# Patient Record
Sex: Male | Born: 1990 | Race: Black or African American | Hispanic: No | Marital: Single | Smoking: Never smoker
Health system: Southern US, Community
[De-identification: ages and names within clinical notes are randomized; demographics above are authoritative.]

## PROBLEM LIST (undated history)

## (undated) DIAGNOSIS — J45909 Unspecified asthma, uncomplicated: Secondary | ICD-10-CM

## (undated) HISTORY — PX: ROTATOR CUFF REPAIR: SHX139

---
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
## 2009-05-31 ENCOUNTER — Emergency Department: Payer: Self-pay | Admitting: Emergency Medicine

## 2009-11-06 ENCOUNTER — Emergency Department: Payer: Self-pay | Admitting: Emergency Medicine

## 2009-12-11 ENCOUNTER — Emergency Department: Payer: Self-pay | Admitting: Emergency Medicine

## 2010-10-04 ENCOUNTER — Emergency Department: Payer: Self-pay | Admitting: Unknown Physician Specialty

## 2011-03-19 ENCOUNTER — Emergency Department: Payer: Self-pay | Admitting: Emergency Medicine

## 2011-04-28 ENCOUNTER — Emergency Department: Payer: Self-pay | Admitting: Emergency Medicine

## 2011-05-02 ENCOUNTER — Emergency Department: Payer: Self-pay | Admitting: Emergency Medicine

## 2011-05-11 ENCOUNTER — Emergency Department: Payer: Self-pay | Admitting: Emergency Medicine

## 2011-05-12 ENCOUNTER — Emergency Department: Payer: Self-pay | Admitting: Emergency Medicine

## 2011-08-19 ENCOUNTER — Emergency Department: Payer: Self-pay | Admitting: Emergency Medicine

## 2012-04-26 ENCOUNTER — Emergency Department: Payer: Self-pay | Admitting: Emergency Medicine

## 2012-04-26 LAB — CBC
HGB: 15 g/dL (ref 13.0–18.0)
MCH: 30.5 pg (ref 26.0–34.0)
MCHC: 33.7 g/dL (ref 32.0–36.0)
MCV: 91 fL (ref 80–100)
Platelet: 300 10*3/uL (ref 150–440)
RDW: 13.2 % (ref 11.5–14.5)

## 2012-04-26 LAB — URINALYSIS, COMPLETE
Bacteria: NONE SEEN
Blood: NEGATIVE
Leukocyte Esterase: NEGATIVE
Ph: 6 (ref 4.5–8.0)
Protein: NEGATIVE
Specific Gravity: 1.019 (ref 1.003–1.030)
WBC UR: <1 /HPF (ref 0–5)

## 2012-04-26 LAB — COMPREHENSIVE METABOLIC PANEL
Anion Gap: 10 (ref 7–16)
BUN: 11 mg/dL (ref 7–18)
Chloride: 100 mmol/L (ref 98–107)
Co2: 26 mmol/L (ref 21–32)
EGFR (African American): >60
Osmolality: 270 (ref 275–301)
Potassium: 3.5 mmol/L (ref 3.5–5.1)
SGOT(AST): 22 U/L (ref 15–37)
SGPT (ALT): 21 U/L (ref 12–78)
Total Protein: 9.3 g/dL — ABNORMAL HIGH (ref 6.4–8.2)

## 2012-04-26 LAB — LIPASE, BLOOD: Lipase: 153 U/L (ref 73–393)

## 2012-04-27 ENCOUNTER — Emergency Department: Payer: Self-pay | Admitting: Emergency Medicine

## 2012-04-27 LAB — BASIC METABOLIC PANEL
Anion Gap: 7 (ref 7–16)
Calcium, Total: 9.2 mg/dL (ref 8.5–10.1)
Chloride: 105 mmol/L (ref 98–107)
Co2: 28 mmol/L (ref 21–32)
Creatinine: 1.13 mg/dL (ref 0.60–1.30)
Glucose: 92 mg/dL (ref 65–99)
Sodium: 140 mmol/L (ref 136–145)

## 2012-04-27 LAB — CBC WITH DIFFERENTIAL/PLATELET
Basophil #: 0.1 10*3/uL (ref 0.0–0.1)
Basophil %: 0.7 %
Eosinophil %: 1.3 %
Lymphocyte #: 1.5 10*3/uL (ref 1.0–3.6)
MCH: 30.3 pg (ref 26.0–34.0)
MCHC: 33.5 g/dL (ref 32.0–36.0)
MCV: 91 fL (ref 80–100)
Monocyte %: 8.4 %
Neutrophil #: 6.4 10*3/uL (ref 1.4–6.5)
Neutrophil %: 72.9 %
Platelet: 255 10*3/uL (ref 150–440)
RBC: 4.28 10*6/uL — ABNORMAL LOW (ref 4.40–5.90)
RDW: 13.3 % (ref 11.5–14.5)

## 2012-04-28 ENCOUNTER — Emergency Department: Payer: Self-pay | Admitting: Emergency Medicine

## 2012-08-07 ENCOUNTER — Emergency Department: Payer: Self-pay | Admitting: Emergency Medicine

## 2012-11-28 ENCOUNTER — Emergency Department: Payer: Self-pay | Admitting: Emergency Medicine

## 2012-11-28 LAB — RAPID INFLUENZA A&B ANTIGENS

## 2012-11-30 LAB — BETA STREP CULTURE(ARMC)

## 2013-02-13 ENCOUNTER — Emergency Department: Payer: Self-pay | Admitting: Emergency Medicine

## 2013-06-22 ENCOUNTER — Emergency Department: Payer: Self-pay | Admitting: Internal Medicine

## 2014-05-27 ENCOUNTER — Emergency Department: Payer: Self-pay | Admitting: Emergency Medicine

## 2014-08-04 ENCOUNTER — Emergency Department: Payer: Self-pay | Admitting: Emergency Medicine

## 2015-01-04 ENCOUNTER — Emergency Department
Admit: 2015-01-04 | Disposition: A | Discharge status: Home / Self Care | Payer: Self-pay | Admitting: Emergency Medicine

## 2015-01-04 LAB — TROPONIN I: Troponin-I: <0.03 ng/mL

## 2015-01-04 LAB — BASIC METABOLIC PANEL
Anion Gap: 6 — ABNORMAL LOW (ref 7–16)
BUN: 12 mg/dL
Calcium, Total: 8.9 mg/dL
Chloride: 107 mmol/L
Co2: 28 mmol/L
Creatinine: 1.19 mg/dL
EGFR (African American): >60
EGFR (Non-African Amer.): >60
GLUCOSE: 94 mg/dL
POTASSIUM: 3.3 mmol/L — AB
SODIUM: 141 mmol/L

## 2015-01-04 LAB — CBC
HCT: 41.5 % (ref 40.0–52.0)
HGB: 13.3 g/dL (ref 13.0–18.0)
MCH: 29.4 pg (ref 26.0–34.0)
MCHC: 32.1 g/dL (ref 32.0–36.0)
MCV: 92 fL (ref 80–100)
PLATELETS: 256 10*3/uL (ref 150–440)
RBC: 4.54 10*6/uL (ref 4.40–5.90)
RDW: 13.1 % (ref 11.5–14.5)
WBC: 6.2 10*3/uL (ref 3.8–10.6)

## 2015-04-29 ENCOUNTER — Emergency Department
Admission: EM | Admit: 2015-04-29 | Discharge: 2015-04-29 | Disposition: A | Discharge status: Home / Self Care | Payer: Self-pay | Attending: Emergency Medicine | Admitting: Emergency Medicine

## 2015-04-29 ENCOUNTER — Encounter: Payer: Self-pay | Admitting: *Deleted

## 2015-04-29 DIAGNOSIS — R0981 Nasal congestion: Secondary | ICD-10-CM

## 2015-04-29 DIAGNOSIS — J0100 Acute maxillary sinusitis, unspecified: Secondary | ICD-10-CM | POA: Insufficient documentation

## 2015-04-29 HISTORY — DX: Unspecified asthma, uncomplicated: J45.909

## 2015-04-29 LAB — POCT RAPID STREP A: Streptococcus, Group A Screen (Direct): NEGATIVE

## 2015-04-29 MED ORDER — ACETAMINOPHEN 325 MG PO TABS
650.0000 mg | ORAL_TABLET | Freq: Once | ORAL | Status: AC | PRN
  Administered 2015-04-29: 650 mg via ORAL
  Filled 2015-04-29: qty 2

## 2015-04-29 MED ORDER — OXYMETAZOLINE HCL 0.05 % NA SOLN
1.0000 | Freq: Once | NASAL | Status: AC
  Administered 2015-04-29: 1 via NASAL
  Filled 2015-04-29: qty 15

## 2015-04-29 MED ORDER — PSEUDOEPHEDRINE HCL 30 MG PO TABS: 30.0000 mg | ORAL_TABLET | Freq: Four times a day (QID) | ORAL | Status: DC | PRN

## 2015-04-29 MED ORDER — IBUPROFEN 800 MG PO TABS: 800.0000 mg | ORAL_TABLET | Freq: Three times a day (TID) | ORAL | Status: DC | PRN

## 2015-04-29 MED ORDER — IBUPROFEN 800 MG PO TABS
800.0000 mg | ORAL_TABLET | Freq: Once | ORAL | Status: AC
  Administered 2015-04-29: 800 mg via ORAL
  Filled 2015-04-29: qty 1

## 2015-04-29 NOTE — ED Notes (Signed)
MD at bedside. 

## 2015-04-29 NOTE — ED Notes (Signed)
Pt presents w/ c/o sinus congestion and drainage, fever, and sore throat.

## 2015-04-29 NOTE — ED Provider Notes (Signed)
[facility] Emergency Department Provider Note  ____________________________________________  Time seen: Approximately 551 AM  I have reviewed the triage vital signs and the nursing notes.   HISTORY  Chief Complaint Sore Throat; Nasal Congestion; and Fever    HPI Glenn Acosta is a 24 y.o. male who comes in tonight with some congestion and sore throat. The patient reports that yesterday he noticed some green mucus when he blew his nose. He also reports that he had a temperature to 99 yesterday morning and his anterior neck has been hurting him. The patient reports that he took a small dose of NyQuil and it did not seem to help his symptoms. The patient reports that he also has a headache that he has not taken anything for. The patient denies any sick contacts and feels as though with the swelling in his neck it is hard to swallow. He rates his headache 8 out of 10 in intensity. The patient has not had any cough or any blurred vision. The patient reports that he was unable to go to work so he decided to come into the emergency department for evaluation.   Past Medical History  Diagnosis Date  . Asthma     There are no active problems to display for this patient.   Past Surgical History  Procedure Laterality Date  . Rotator cuff repair Left     Current Outpatient Rx  Name  Route  Sig  Dispense  Refill  . ibuprofen (ADVIL,MOTRIN) 800 MG tablet   Oral   Take 1 tablet (800 mg total) by mouth every 8 (eight) hours as needed.   15 tablet   0   . pseudoephedrine (SUDAFED) 30 MG tablet   Oral   Take 1 tablet (30 mg total) by mouth every 6 (six) hours as needed for congestion.   24 tablet   0     Allergies Shellfish allergy  History reviewed. No pertinent family history.  Social History Social History  Substance Use Topics  . Smoking status: Never Smoker   . Smokeless tobacco: Never Used  . Alcohol Use: No    Review of  Systems Constitutional: No fever/chills Eyes: No visual changes. ENT:  sore throat, congestion and facial pain Cardiovascular: Denies chest pain. Respiratory: Denies shortness of breath. Gastrointestinal: No abdominal pain.  No nausea, no vomiting.  No diarrhea.  No constipation. Genitourinary: Negative for dysuria. Musculoskeletal: Negative for back pain. Skin: Negative for rash. Neurological: Headache  10-point ROS otherwise negative.  ____________________________________________   PHYSICAL EXAM:  VITAL SIGNS: ED Triage Vitals  Enc Vitals Group     BP 04/29/15 0405 118/75 mmHg     Pulse Rate 04/29/15 0405 66     Resp 04/29/15 0405 16     Temp 04/29/15 0405 98 F (36.7 C)     Temp Source 04/29/15 0405 Oral     SpO2 04/29/15 0405 98 %     Weight 04/29/15 0405 180 lb (81.647 kg)     Height 04/29/15 0405 6\' 3"  (1.905 m)     Head Cir --      Peak Flow --      Pain Score 04/29/15 0406 7     Pain Loc --      Pain Edu? --      Excl. in GC? --     Constitutional: Alert and oriented. Well appearing and in mild distress. Eyes: Conjunctivae are normal. PERRL. EOMI. Head: Atraumatic. Nose:  Congestion, ethmoid, maxillary and  sphenoid sinus tenderness to palpation Mouth/Throat: Mucous membranes are moist.  Oropharynx mildly with no petechiae or exudates Hematological/Lymphatic/Immunilogical: Anterior cervical lymphadenopathy. Cardiovascular: Normal rate, regular rhythm.  Respiratory: Normal respiratory effort.  No retractions. Lungs CTAB. Gastrointestinal: Soft and nontender. No distention. Positive bowel sounds Genitourinary: Deferred Musculoskeletal: No lower extremity tenderness nor edema.   Neurologic:  Normal speech and language.  Skin:  Skin is warm, dry and intact. No rash noted. Psychiatric: Mood and affect are normal.   ____________________________________________   LABS (all labs ordered are listed, but only abnormal results are displayed)  Labs Reviewed   CULTURE, GROUP A STREP ([facility] ONLY)  POCT RAPID STREP A   ____________________________________________  EKG  None ____________________________________________  RADIOLOGY  None ____________________________________________   PROCEDURES  Procedure(s) performed: None  Critical Care performed: No  ____________________________________________   INITIAL IMPRESSION / ASSESSMENT AND PLAN / ED COURSE  Pertinent labs & imaging results that were available during my care of the patient were reviewed by me and considered in my medical decision making (see chart for details).  This is a 24 year old male who comes in with some facial pain, sinus pressure and green mucus. The patient had a strep test that was unremarkable. I will give the patient a dose of ibuprofen 800 mg orally 1 as well as some Afrin to his nose. Since the symptoms started 1 day ago I will not treat with antibiotics as it is more likely viral and have him reassessed by his primary care physician or the acute care clinic. ____________________________________________   FINAL CLINICAL IMPRESSION(S) / ED DIAGNOSES  Final diagnoses:  Acute maxillary sinusitis, recurrence not specified  Nasal congestion      Rebecka Apley, MD 04/29/15 [id number]

## 2015-04-29 NOTE — Discharge Instructions (Signed)
Sinusitis °Sinusitis is redness, soreness, and inflammation of the paranasal sinuses. Paranasal sinuses are air pockets within the bones of your face (beneath the eyes, the middle of the forehead, or above the eyes). In healthy paranasal sinuses, mucus is able to drain out, and air is able to circulate through them by way of your nose. However, when your paranasal sinuses are inflamed, mucus and air can become trapped. This can allow bacteria and other germs to grow and cause infection. °Sinusitis can develop quickly and last only a short time (acute) or continue over a long period (chronic). Sinusitis that lasts for more than 12 weeks is considered chronic.  °CAUSES  °Causes of sinusitis include: °· Allergies. °· Structural abnormalities, such as displacement of the cartilage that separates your nostrils (deviated septum), which can decrease the air flow through your nose and sinuses and affect sinus drainage. °· Functional abnormalities, such as when the small hairs (cilia) that line your sinuses and help remove mucus do not work properly or are not present. °SIGNS AND SYMPTOMS  °Symptoms of acute and chronic sinusitis are the same. The primary symptoms are pain and pressure around the affected sinuses. Other symptoms include: °· Upper toothache. °· Earache. °· Headache. °· Bad breath. °· Decreased sense of smell and taste. °· A cough, which worsens when you are lying flat. °· Fatigue. °· Fever. °· Thick drainage from your nose, which often is green and may contain pus (purulent). °· Swelling and warmth over the affected sinuses. °DIAGNOSIS  °Your health care provider will perform a physical exam. During the exam, your health care provider may: °· Look in your nose for signs of abnormal growths in your nostrils (nasal polyps). °· Tap over the affected sinus to check for signs of infection. °· View the inside of your sinuses (endoscopy) using an imaging device that has a light attached (endoscope). °If your health  care provider suspects that you have chronic sinusitis, one or more of the following tests may be recommended: °· Allergy tests. °· Nasal culture. A sample of mucus is taken from your nose, sent to a lab, and screened for bacteria. °· Nasal cytology. A sample of mucus is taken from your nose and examined by your health care provider to determine if your sinusitis is related to an allergy. °TREATMENT  °Most cases of acute sinusitis are related to a viral infection and will resolve on their own within 10 days. Sometimes medicines are prescribed to help relieve symptoms (pain medicine, decongestants, nasal steroid sprays, or saline sprays).  °However, for sinusitis related to a bacterial infection, your health care provider will prescribe antibiotic medicines. These are medicines that will help kill the bacteria causing the infection.  °Rarely, sinusitis is caused by a fungal infection. In theses cases, your health care provider will prescribe antifungal medicine. °For some cases of chronic sinusitis, surgery is needed. Generally, these are cases in which sinusitis recurs more than 3 times per year, despite other treatments. °HOME CARE INSTRUCTIONS  °· Drink plenty of water. Water helps thin the mucus so your sinuses can drain more easily. °· Use a humidifier. °· Inhale steam 3 to 4 times a day (for example, sit in the bathroom with the shower running). °· Apply a warm, moist washcloth to your face 3 to 4 times a day, or as directed by your health care provider. °· Use saline nasal sprays to help moisten and clean your sinuses. °· Take medicines only as directed by your health care provider. °·   If you were prescribed either an antibiotic or antifungal medicine, finish it all even if you start to feel better. °SEEK IMMEDIATE MEDICAL CARE IF: °· You have increasing pain or severe headaches. °· You have nausea, vomiting, or drowsiness. °· You have swelling around your face. °· You have vision problems. °· You have a stiff  neck. °· You have difficulty breathing. °MAKE SURE YOU:  °· Understand these instructions. °· Will watch your condition. °· Will get help right away if you are not doing well or get worse. °Document Released: 08/31/2005 Document Revised: 01/15/2014 Document Reviewed: 09/15/2011 °[employer]® Patient Information ©2015 [employer]. This information is not intended to replace advice given to you by your health care provider. Make sure you discuss any questions you have with your health care provider. ° °Upper Respiratory Infection, Adult °An upper respiratory infection (URI) is also sometimes known as the common cold. The upper respiratory tract includes the nose, sinuses, throat, trachea, and bronchi. Bronchi are the airways leading to the lungs. Most people improve within 1 week, but symptoms can last up to 2 weeks. A residual cough may last even longer.  °CAUSES °Many different viruses can infect the tissues lining the upper respiratory tract. The tissues become irritated and inflamed and often become very moist. Mucus production is also common. A cold is contagious. You can easily spread the virus to others by oral contact. This includes kissing, sharing a glass, coughing, or sneezing. Touching your mouth or nose and then touching a surface, which is then touched by another person, can also spread the virus. °SYMPTOMS  °Symptoms typically develop 1 to 3 days after you come in contact with a cold virus. Symptoms vary from person to person. They may include: °· Runny nose. °· Sneezing. °· Nasal congestion. °· Sinus irritation. °· Sore throat. °· Loss of voice (laryngitis). °· Cough. °· Fatigue. °· Muscle aches. °· Loss of appetite. °· Headache. °· Low-grade fever. °DIAGNOSIS  °You might diagnose your own cold based on familiar symptoms, since most people get a cold 2 to 3 times a year. Your caregiver can confirm this based on your exam. Most importantly, your caregiver can check that your symptoms are not due to  another disease such as strep throat, sinusitis, pneumonia, asthma, or epiglottitis. Blood tests, throat tests, and X-rays are not necessary to diagnose a common cold, but they may sometimes be helpful in excluding other more serious diseases. Your caregiver will decide if any further tests are required. °RISKS AND COMPLICATIONS  °You may be at risk for a more severe case of the common cold if you smoke cigarettes, have chronic heart disease (such as heart failure) or lung disease (such as asthma), or if you have a weakened immune system. The very young and very old are also at risk for more serious infections. Bacterial sinusitis, middle ear infections, and bacterial pneumonia can complicate the common cold. The common cold can worsen asthma and chronic obstructive pulmonary disease (COPD). Sometimes, these complications can require emergency medical care and may be life-threatening. °PREVENTION  °The best way to protect against getting a cold is to practice good hygiene. Avoid oral or hand contact with people with cold symptoms. Wash your hands often if contact occurs. There is no clear evidence that vitamin C, vitamin E, echinacea, or exercise reduces the chance of developing a cold. However, it is always recommended to get plenty of rest and practice good nutrition. °TREATMENT  °Treatment is directed at relieving symptoms. There is no   cure. Antibiotics are not effective, because the infection is caused by a virus, not by bacteria. Treatment may include:  Increased fluid intake. Sports drinks offer valuable electrolytes, sugars, and fluids.  Breathing heated mist or steam (vaporizer or shower).  Eating chicken soup or other clear broths, and maintaining good nutrition.  Getting plenty of rest.  Using gargles or lozenges for comfort.  Controlling fevers with ibuprofen or acetaminophen as directed by your caregiver.  Increasing usage of your inhaler if you have asthma. Zinc gel and zinc lozenges,  taken in the first 24 hours of the common cold, can shorten the duration and lessen the severity of symptoms. Pain medicines may help with fever, muscle aches, and throat pain. A variety of non-prescription medicines are available to treat congestion and runny nose. Your caregiver can make recommendations and may suggest nasal or lung inhalers for other symptoms.  HOME CARE INSTRUCTIONS   Only take over-the-counter or prescription medicines for pain, discomfort, or fever as directed by your caregiver.  Use a warm mist humidifier or inhale steam from a shower to increase air moisture. This may keep secretions moist and make it easier to breathe.  Drink enough water and fluids to keep your urine clear or pale yellow.  Rest as needed.  Return to work when your temperature has returned to normal or as your caregiver advises. You may need to stay home longer to avoid infecting others. You can also use a face mask and careful hand washing to prevent spread of the virus. SEEK MEDICAL CARE IF:   After the first few days, you feel you are getting worse rather than better.  You need your caregiver's advice about medicines to control symptoms.  You develop chills, worsening shortness of breath, or brown or red sputum. These may be signs of pneumonia.  You develop yellow or brown nasal discharge or pain in the face, especially when you bend forward. These may be signs of sinusitis.  You develop a fever, swollen neck glands, pain with swallowing, or white areas in the back of your throat. These may be signs of strep throat. SEEK IMMEDIATE MEDICAL CARE IF:   You have a fever.  You develop severe or persistent headache, ear pain, sinus pain, or chest pain.  You develop wheezing, a prolonged cough, cough up blood, or have a change in your usual mucus (if you have chronic lung disease).  You develop sore muscles or a stiff neck. Document Released: 02/24/2001 Document Revised: 11/23/2011 Document  Reviewed: 12/06/2013 [facility] Patient Information 2015 [place], [place]. This information is not intended to replace advice given to you by your health care provider. Make sure you discuss any questions you have with your health care provider.  Headaches, Frequently Asked Questions MIGRAINE HEADACHES Q: What is migraine? What causes it? How can I treat it? A: Generally, migraine headaches begin as a dull ache. Then they develop into a constant, throbbing, and pulsating pain. You may experience pain at the temples. You may experience pain at the front or back of one or both sides of the head. The pain is usually accompanied by a combination of:  Nausea.  Vomiting.  Sensitivity to light and noise. Some people (about 15%) experience an aura (see below) before an attack. The cause of migraine is believed to be chemical reactions in the brain. Treatment for migraine may include over-the-counter or prescription medications. It may also include self-help techniques. These include relaxation training and biofeedback.  Q: What is an aura? A:  About 15% of people with migraine get an "aura". This is a sign of neurological symptoms that occur before a migraine headache. You may see wavy or jagged lines, dots, or flashing lights. You might experience tunnel vision or blind spots in one or both eyes. The aura can include visual or auditory hallucinations (something imagined). It may include disruptions in smell (such as strange odors), taste or touch. Other symptoms include:  Numbness.  A "pins and needles" sensation.  Difficulty in recalling or speaking the correct word. These neurological events may last as long as 60 minutes. These symptoms will fade as the headache begins. Q: What is a trigger? A: Certain physical or environmental factors can lead to or "trigger" a migraine. These include:  Foods.  Hormonal changes.  Weather.  Stress. It is important to remember that triggers are different for  everyone. To help prevent migraine attacks, you need to figure out which triggers affect you. Keep a headache diary. This is a good way to track triggers. The diary will help you talk to your healthcare professional about your condition. Q: Does weather affect migraines? A: Bright sunshine, hot, humid conditions, and drastic changes in barometric pressure may lead to, or "trigger," a migraine attack in some people. But studies have shown that weather does not act as a trigger for everyone with migraines. Q: What is the link between migraine and hormones? A: Hormones start and regulate many of your body's functions. Hormones keep your body in balance within a constantly changing environment. The levels of hormones in your body are unbalanced at times. Examples are during menstruation, pregnancy, or menopause. That can lead to a migraine attack. In fact, about three quarters of all women with migraine report that their attacks are related to the menstrual cycle.  Q: Is there an increased risk of stroke for migraine sufferers? A: The likelihood of a migraine attack causing a stroke is very remote. That is not to say that migraine sufferers cannot have a stroke associated with their migraines. In persons under age 62, the most common associated factor for stroke is migraine headache. But over the course of a person's normal life span, the occurrence of migraine headache may actually be associated with a reduced risk of dying from cerebrovascular disease due to stroke.  Q: What are acute medications for migraine? A: Acute medications are used to treat the pain of the headache after it has started. Examples over-the-counter medications, NSAIDs, ergots, and triptans.  Q: What are the triptans? A: Triptans are the newest class of abortive medications. They are specifically targeted to treat migraine. Triptans are vasoconstrictors. They moderate some chemical reactions in the brain. The triptans work on receptors  in your brain. Triptans help to restore the balance of a neurotransmitter called serotonin. Fluctuations in levels of serotonin are thought to be a main cause of migraine.  Q: Are over-the-counter medications for migraine effective? A: Over-the-counter, or "OTC," medications may be effective in relieving mild to moderate pain and associated symptoms of migraine. But you should see your caregiver before beginning any treatment regimen for migraine.  Q: What are preventive medications for migraine? A: Preventive medications for migraine are sometimes referred to as "prophylactic" treatments. They are used to reduce the frequency, severity, and length of migraine attacks. Examples of preventive medications include antiepileptic medications, antidepressants, beta-blockers, calcium channel blockers, and NSAIDs (nonsteroidal anti-inflammatory drugs). Q: Why are anticonvulsants used to treat migraine? A: During the past few years, there has been an increased  interest in antiepileptic drugs for the prevention of migraine. They are sometimes referred to as "anticonvulsants". Both epilepsy and migraine may be caused by similar reactions in the brain.  Q: Why are antidepressants used to treat migraine? A: Antidepressants are typically used to treat people with depression. They may reduce migraine frequency by regulating chemical levels, such as serotonin, in the brain.  Q: What alternative therapies are used to treat migraine? A: The term "alternative therapies" is often used to describe treatments considered outside the scope of conventional Western medicine. Examples of alternative therapy include acupuncture, acupressure, and yoga. Another common alternative treatment is herbal therapy. Some herbs are believed to relieve headache pain. Always discuss alternative therapies with your caregiver before proceeding. Some herbal products contain arsenic and other toxins. TENSION HEADACHES Q: What is a tension-type  headache? What causes it? How can I treat it? A: Tension-type headaches occur randomly. They are often the result of temporary stress, anxiety, fatigue, or anger. Symptoms include soreness in your temples, a tightening band-like sensation around your head (a "vice-like" ache). Symptoms can also include a pulling feeling, pressure sensations, and contracting head and neck muscles. The headache begins in your forehead, temples, or the back of your head and neck. Treatment for tension-type headache may include over-the-counter or prescription medications. Treatment may also include self-help techniques such as relaxation training and biofeedback. CLUSTER HEADACHES Q: What is a cluster headache? What causes it? How can I treat it? A: Cluster headache gets its name because the attacks come in groups. The pain arrives with little, if any, warning. It is usually on one side of the head. A tearing or bloodshot eye and a runny nose on the same side of the headache may also accompany the pain. Cluster headaches are believed to be caused by chemical reactions in the brain. They have been described as the most severe and intense of any headache type. Treatment for cluster headache includes prescription medication and oxygen. SINUS HEADACHES Q: What is a sinus headache? What causes it? How can I treat it? A: When a cavity in the bones of the face and skull (a sinus) becomes inflamed, the inflammation will cause localized pain. This condition is usually the result of an allergic reaction, a tumor, or an infection. If your headache is caused by a sinus blockage, such as an infection, you will probably have a fever. An x-ray will confirm a sinus blockage. Your caregiver's treatment might include antibiotics for the infection, as well as antihistamines or decongestants.  REBOUND HEADACHES Q: What is a rebound headache? What causes it? How can I treat it? A: A pattern of taking acute headache medications too often can lead  to a condition known as "rebound headache." A pattern of taking too much headache medication includes taking it more than 2 days per week or in excessive amounts. That means more than the label or a caregiver advises. With rebound headaches, your medications not only stop relieving pain, they actually begin to cause headaches. Doctors treat rebound headache by tapering the medication that is being overused. Sometimes your caregiver will gradually substitute a different type of treatment or medication. Stopping may be a challenge. Regularly overusing a medication increases the potential for serious side effects. Consult a caregiver if you regularly use headache medications more than 2 days per week or more than the label advises. ADDITIONAL QUESTIONS AND ANSWERS Q: What is biofeedback? A: Biofeedback is a self-help treatment. Biofeedback uses special equipment to monitor your body's involuntary  physical responses. Biofeedback monitors:  Breathing.  Pulse.  Heart rate.  Temperature.  Muscle tension.  Brain activity. Biofeedback helps you refine and perfect your relaxation exercises. You learn to control the physical responses that are related to stress. Once the technique has been mastered, you do not need the equipment any more. Q: Are headaches hereditary? A: Four out of five (80%) of people that suffer report a family history of migraine. Scientists are not sure if this is genetic or a family predisposition. Despite the uncertainty, a child has a 50% chance of having migraine if one parent suffers. The child has a 75% chance if both parents suffer.  Q: Can children get headaches? A: By the time they reach high school, most young people have experienced some type of headache. Many safe and effective approaches or medications can prevent a headache from occurring or stop it after it has begun.  Q: What type of doctor should I see to diagnose and treat my headache? A: Start with your primary  caregiver. Discuss his or her experience and approach to headaches. Discuss methods of classification, diagnosis, and treatment. Your caregiver may decide to recommend you to a headache specialist, depending upon your symptoms or other physical conditions. Having diabetes, allergies, etc., may require a more comprehensive and inclusive approach to your headache. The National [employer] will provide, upon request, a list of [facility] physician members in your state. Document Released: 11/21/2003 Document Revised: 11/23/2011 Document Reviewed: 04/30/2008 [facility] Patient Information 2015 [place], [place]. This information is not intended to replace advice given to you by your health care provider. Make sure you discuss any questions you have with your health care provider.

## 2015-04-29 NOTE — ED Notes (Signed)
Patient reports sinus pressure and pain with green mucus.  Also reports sore throat.  Symptoms began Saturday morning.

## 2015-05-01 ENCOUNTER — Encounter: Payer: Self-pay | Admitting: Emergency Medicine

## 2015-05-01 ENCOUNTER — Emergency Department
Admission: EM | Admit: 2015-05-01 | Discharge: 2015-05-02 | Disposition: A | Discharge status: Home / Self Care | Payer: Self-pay | Attending: Emergency Medicine | Admitting: Emergency Medicine

## 2015-05-01 ENCOUNTER — Emergency Department: Payer: Self-pay

## 2015-05-01 DIAGNOSIS — R079 Chest pain, unspecified: Secondary | ICD-10-CM | POA: Insufficient documentation

## 2015-05-01 LAB — CULTURE, GROUP A STREP (THRC)

## 2015-05-01 LAB — CBC
HCT: 39.3 % — ABNORMAL LOW (ref 40.0–52.0)
Hemoglobin: 12.8 g/dL — ABNORMAL LOW (ref 13.0–18.0)
MCH: 29.4 pg (ref 26.0–34.0)
MCHC: 32.5 g/dL (ref 32.0–36.0)
MCV: 90.5 fL (ref 80.0–100.0)
Platelets: 234 10*3/uL (ref 150–440)
RBC: 4.34 MIL/uL — AB (ref 4.40–5.90)
RDW: 12.8 % (ref 11.5–14.5)
WBC: 7 10*3/uL (ref 3.8–10.6)

## 2015-05-01 NOTE — ED Notes (Addendum)
Patient ambulatory to triage with steady gait, without difficulty or distress noted; pt reports mid CP radiating to left side accomp by SOB; st treated for URI recently; denies cough; pt reports hx CP as a child, especially when playing football but never examined for such; pt bradycardic [id number]

## 2015-05-02 LAB — BASIC METABOLIC PANEL
Anion gap: 6 (ref 5–15)
BUN: 14 mg/dL (ref 6–20)
CHLORIDE: 106 mmol/L (ref 101–111)
CO2: 28 mmol/L (ref 22–32)
Calcium: 8.8 mg/dL — ABNORMAL LOW (ref 8.9–10.3)
Creatinine, Ser: 1.13 mg/dL (ref 0.61–1.24)
GFR calc Af Amer: >60 mL/min (ref >60)
GFR calc non Af Amer: >60 mL/min (ref >60)
Glucose, Bld: 85 mg/dL (ref 65–99)
POTASSIUM: 4 mmol/L (ref 3.5–5.1)
SODIUM: 140 mmol/L (ref 135–145)

## 2015-05-02 LAB — TROPONIN I: Troponin I: <0.03 ng/mL (ref <0.031)

## 2015-05-02 LAB — FIBRIN DERIVATIVES D-DIMER (ARMC ONLY): Fibrin derivatives D-dimer (ARMC): 196 (ref 0–499)

## 2015-05-02 NOTE — Discharge Instructions (Signed)

## 2015-05-02 NOTE — ED Provider Notes (Signed)
[facility] Emergency Department Provider Note  ____________________________________________  Time seen: 1:00am  I have reviewed the triage vital signs and the nursing notes.   HISTORY  Chief Complaint Chest Pain     HPI Glenn Acosta is a 24 y.o. male presents with 5 out of 10 central chest pain with radiation to left shoulder. Denies dyspnea, no nausea no diaphoresis. Patient states that he recalls having similar pain when he was younger especially after playing football or with exertion. Patient noted to be bradycardic on presentation emergency department with a heart rate of 46     Past Medical History  Diagnosis Date  . Asthma     There are no active problems to display for this patient.   Past Surgical History  Procedure Laterality Date  . Rotator cuff repair Left     Current Outpatient Rx  Name  Route  Sig  Dispense  Refill  . ibuprofen (ADVIL,MOTRIN) 800 MG tablet   Oral   Take 1 tablet (800 mg total) by mouth every 8 (eight) hours as needed.   15 tablet   0   . pseudoephedrine (SUDAFED) 30 MG tablet   Oral   Take 1 tablet (30 mg total) by mouth every 6 (six) hours as needed for congestion.   24 tablet   0     Allergies Iodine and Shellfish allergy  No family history on file.  Social History Social History  Substance Use Topics  . Smoking status: Never Smoker   . Smokeless tobacco: Never Used  . Alcohol Use: No    Review of Systems  Constitutional: Negative for fever. Eyes: Negative for visual changes. ENT: Negative for sore throat. Cardiovascular: Positive for chest pain. Respiratory: Negative for shortness of breath. Gastrointestinal: Negative for abdominal pain, vomiting and diarrhea. Genitourinary: Negative for dysuria. Musculoskeletal: Negative for back pain. Skin: Negative for rash. Neurological: Negative for headaches, focal weakness or numbness.   10-point ROS otherwise  negative.  ____________________________________________   PHYSICAL EXAM:  VITAL SIGNS: ED Triage Vitals  Enc Vitals Group     BP 05/01/15 2332 118/70 mmHg     Pulse Rate 05/01/15 2332 48     Resp 05/01/15 2332 18     Temp 05/01/15 2332 97.5 F (36.4 C)     Temp Source 05/01/15 2332 Oral     SpO2 05/01/15 2332 99 %     Weight 05/01/15 2332 180 lb (81.647 kg)     Height 05/01/15 2332 6\' 3"  (1.905 m)     Head Cir --      Peak Flow --      Pain Score 05/01/15 2329 7     Pain Loc --      Pain Edu? --      Excl. in GC? --      Constitutional: Alert and oriented. Well appearing and in no distress. Eyes: Conjunctivae are normal. PERRL. Normal extraocular movements. ENT   Head: Normocephalic and atraumatic.   Nose: No congestion/rhinnorhea.   Mouth/Throat: Mucous membranes are moist.   Neck: No stridor. Cardiovascular: Normal rate, regular rhythm. Normal and symmetric distal pulses are present in all extremities. No murmurs, rubs, or gallops. Respiratory: Normal respiratory effort without tachypnea nor retractions. Breath sounds are clear and equal bilaterally. No wheezes/rales/rhonchi. Gastrointestinal: Soft and nontender. No distention. There is no CVA tenderness. Genitourinary: deferred Musculoskeletal: Nontender with normal range of motion in all extremities. No joint effusions.  No lower extremity tenderness nor edema. Neurologic:  Normal speech and language. No gross focal neurologic deficits are appreciated. Speech is normal.  Skin:  Skin is warm, dry and intact. No rash noted. Psychiatric: Mood and affect are normal. Speech and behavior are normal. Patient exhibits appropriate insight and judgment.  ____________________________________________    LABS (pertinent positives/negatives)  Labs Reviewed  BASIC METABOLIC PANEL - Abnormal; Notable for the following:    Calcium 8.8 (*)    All other components within normal limits  CBC - Abnormal; Notable for the  following:    RBC 4.34 (*)    Hemoglobin 12.8 (*)    HCT 39.3 (*)    All other components within normal limits  TROPONIN I  FIBRIN DERIVATIVES D-DIMER ([facility] ONLY)     ____________________________________________   EKG  ED ECG REPORT I, BROWN, [place] N, the attending physician, personally viewed and interpreted this ECG.   Date: 05/02/2015  EKG Time: 23:30  Rate: 47  Rhythm: sinus bradycardia  Axis: none  Intervals:normal  ST&T Change: normal   ____________________________________________    RADIOLOGY    DG Chest 2 View (Final result) Result time: 05/02/15 00:34:21   Final result by Rad Results In Interface (05/02/15 00:34:21)   Narrative:   CLINICAL DATA: Acute onset of mid chest pain radiating to the left side, with shortness of breath. Bradycardia. Initial encounter.  EXAM: CHEST 2 VIEW  COMPARISON: Chest radiograph performed 01/04/2015  FINDINGS: The lungs are well-aerated and clear. There is no evidence of focal opacification, pleural effusion or pneumothorax.  The heart is normal in size; the mediastinal contour is within normal limits. No acute osseous abnormalities are seen.  IMPRESSION: No acute cardiopulmonary process seen.   Electronically Signed By: Roanna Raider M.D.        INITIAL IMPRESSION / ASSESSMENT AND PLAN / ED COURSE  Pertinent labs & imaging results that were available during my care of the patient were reviewed by me and considered in my medical decision making (see chart for details).  Chest x-ray negative cardiac enzymes negative 2.  patient with bradycardia asymptomatic in the 40s but at times will become normal heart rate currently at bedside 62. Patient will be referred to Dr. Vennie Homans cardiologist emphasized the importance of patient keeping this appointment.  ____________________________________________   FINAL CLINICAL IMPRESSION(S) / ED DIAGNOSES  Final diagnoses:  Chest pain, unspecified chest  pain type      Darci Current, MD 05/02/15 [phone]

## 2015-05-02 NOTE — ED Notes (Signed)
Patient given discharge instructions 

## 2015-05-02 NOTE — ED Notes (Signed)
Troponin redrawn and sent.

## 2015-05-02 NOTE — ED Notes (Signed)
Patient describes midsternal chest pain with exertion since he was 24 years old. Has never seen a doctor for it. Tonight on the way to work had onset of same chest pain and became concerned. Here for concerns regarding heart condition. MD in room to evaluate patient.

## 2015-05-03 NOTE — Progress Notes (Signed)
Called patient and left voicemail three times regarding ED culture results and need for treatment with antibiotics. No answer or return call to date.

## 2015-05-26 ENCOUNTER — Emergency Department
Admission: EM | Admit: 2015-05-26 | Discharge: 2015-05-26 | Disposition: A | Discharge status: Home / Self Care | Payer: PRIVATE HEALTH INSURANCE | Attending: Emergency Medicine | Admitting: Emergency Medicine

## 2015-05-26 ENCOUNTER — Encounter: Payer: Self-pay | Admitting: Emergency Medicine

## 2015-05-26 DIAGNOSIS — X58XXXA Exposure to other specified factors, initial encounter: Secondary | ICD-10-CM | POA: Insufficient documentation

## 2015-05-26 DIAGNOSIS — Y9289 Other specified places as the place of occurrence of the external cause: Secondary | ICD-10-CM | POA: Diagnosis not present

## 2015-05-26 DIAGNOSIS — M25511 Pain in right shoulder: Secondary | ICD-10-CM | POA: Diagnosis present

## 2015-05-26 DIAGNOSIS — Y990 Civilian activity done for income or pay: Secondary | ICD-10-CM | POA: Diagnosis not present

## 2015-05-26 DIAGNOSIS — Y9389 Activity, other specified: Secondary | ICD-10-CM | POA: Diagnosis not present

## 2015-05-26 DIAGNOSIS — S46911A Strain of unspecified muscle, fascia and tendon at shoulder and upper arm level, right arm, initial encounter: Secondary | ICD-10-CM | POA: Insufficient documentation

## 2015-05-26 MED ORDER — METHOCARBAMOL 750 MG PO TABS: 1500.0000 mg | ORAL_TABLET | Freq: Four times a day (QID) | ORAL | Status: DC

## 2015-05-26 MED ORDER — IBUPROFEN 800 MG PO TABS: 800.0000 mg | ORAL_TABLET | Freq: Three times a day (TID) | ORAL | Status: DC | PRN

## 2015-05-26 MED ORDER — IBUPROFEN 800 MG PO TABS
800.0000 mg | ORAL_TABLET | Freq: Once | ORAL | Status: AC
  Administered 2015-05-26: 800 mg via ORAL
  Filled 2015-05-26: qty 1

## 2015-05-26 MED ORDER — METHOCARBAMOL 500 MG PO TABS
1000.0000 mg | ORAL_TABLET | Freq: Once | ORAL | Status: AC
  Administered 2015-05-26: 1000 mg via ORAL
  Filled 2015-05-26: qty 2

## 2015-05-26 NOTE — ED Provider Notes (Signed)
[facility] Emergency Department Provider Note  ____________________________________________  Time seen: Approximately 9:24 PM  I have reviewed the triage vital signs and the nursing notes.   HISTORY  Chief Complaint Back Pain    HPI Glenn Acosta is a 24 y.o. male patient complaining of right shoulder pain secondary to overuse syndrome. 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 Patient state the pain radiates from his scapular area to the [facility] joint of his shoulder. Patient states no palliative measures for this complaint. Patient is rating his pain as 8/10. Patient describes the pain as sharp with movement. Patient also relates that he did not follow-up with the cardiologist from his last visit and would like to know the name and contact information.  Past Medical History  Diagnosis Date  . Asthma     There are no active problems to display for this patient.   Past Surgical History  Procedure Laterality Date  . Rotator cuff repair Left     Current Outpatient Rx  Name  Route  Sig  Dispense  Refill  . ibuprofen (ADVIL,MOTRIN) 800 MG tablet   Oral   Take 1 tablet (800 mg total) by mouth every 8 (eight) hours as needed.   15 tablet   0   . pseudoephedrine (SUDAFED) 30 MG tablet   Oral   Take 1 tablet (30 mg total) by mouth every 6 (six) hours as needed for congestion.   24 tablet   0     Allergies Iodine and Shellfish allergy  History reviewed. No pertinent family history.  Social History Social History  Substance Use Topics  . Smoking status: Never Smoker   . Smokeless tobacco: Never Used  . Alcohol Use: No    Review of Systems Constitutional: No fever/chills Eyes: No visual changes. ENT: No sore throat. Cardiovascular: Denies chest pain. Respiratory: Denies shortness of breath. Gastrointestinal: No abdominal pain.  No nausea, no vomiting.  No diarrhea.   No constipation. Genitourinary: Negative for dysuria. Musculoskeletal: Right shoulder pain Skin: Negative for rash. Neurological: Negative for headaches, focal weakness or numbness. 10-point ROS otherwise negative.  ____________________________________________   PHYSICAL EXAM:  VITAL SIGNS: ED Triage Vitals  Enc Vitals Group     BP 05/26/15 2056 114/52 mmHg     Pulse Rate 05/26/15 2056 77     Resp 05/26/15 2056 18     Temp 05/26/15 2056 98.1 F (36.7 C)     Temp Source 05/26/15 2056 Oral     SpO2 05/26/15 2056 98 %     Weight 05/26/15 2056 180 lb (81.647 kg)     Height 05/26/15 2056  (1.905 m)     Head Cir --      Peak Flow --      Pain Score 05/26/15 2100 8     Pain Loc --      Pain Edu? --      Excl. in GC? --     Constitutional: Alert and oriented. Well appearing and in no acute distress. Eyes: Conjunctivae are normal. PERRL. EOMI. Head: Atraumatic. Nose: No congestion/rhinnorhea. Mouth/Throat: Mucous membranes are moist.  Oropharynx non-erythematous. Neck: No stridor.   Cardiovascular: Normal rate, regular rhythm. Grossly normal heart sounds.  Good peripheral circulation. Respiratory: Normal respiratory effort.  No retractions. Lungs CTAB. Gastrointestinal: Soft and nontender. No distention. No abdominal bruits. No CVA tenderness. Musculoskeletal: Patient is right-hand dominant. No obvious  deformity of the right upper extremity. Moderate guarding palpation inferior scapular area on the right side. Decreased range of motion with abduction and opiate reaching. Strength against resistance is 3 over 5. Patient's neurovascular intact. Neurologic:  Normal speech and language. No gross focal neurologic deficits are appreciated. No gait instability. Skin:  Skin is warm, dry and intact. No rash noted. Psychiatric: Mood and affect are normal. Speech and behavior are normal.  ____________________________________________   LABS (all labs ordered are listed, but only  abnormal results are displayed)  Labs Reviewed - No data to display ____________________________________________  EKG   ____________________________________________  RADIOLOGY   ____________________________________________   PROCEDURES  Procedure(s) performed: None  Critical Care performed: No  ____________________________________________   INITIAL IMPRESSION / ASSESSMENT AND PLAN / ED COURSE  Pertinent labs & imaging results that were available during my care of the patient were reviewed by me and considered in my medical decision making (see chart for details).  Right scapular muscle strain. Patient given Robaxin and ibuprofen for 5 days. Patient advised to decrease repetitive motion of the right extremity for 1 week.. Patient [facility] Patient also given contact information for the cardiologist from his previous emergency room visit.. ____________________________________________   FINAL CLINICAL IMPRESSION(S) / ED DIAGNOSES  Final diagnoses:  Muscle strain of right scapular region, initial encounter       Joni Reining, PA-C 05/26/15 2139  Myrna Blazer, MD 05/26/15 2253

## 2015-05-26 NOTE — ED Notes (Signed)
Pt c/o mid right pain that radiates up to right shoulder; says he is doing a new task at his job and felt like he pulled something last Wednesday at work; pain only replicates with the movement he has to do for his job; does not want to file worker's comp

## 2015-05-28 ENCOUNTER — Other Ambulatory Visit: Payer: Self-pay

## 2015-05-28 ENCOUNTER — Emergency Department: Payer: PRIVATE HEALTH INSURANCE

## 2015-05-28 ENCOUNTER — Emergency Department
Admission: EM | Admit: 2015-05-28 | Discharge: 2015-05-28 | Disposition: A | Discharge status: Home / Self Care | Payer: PRIVATE HEALTH INSURANCE | Attending: Emergency Medicine | Admitting: Emergency Medicine

## 2015-05-28 ENCOUNTER — Encounter: Payer: Self-pay | Admitting: Emergency Medicine

## 2015-05-28 DIAGNOSIS — R0789 Other chest pain: Secondary | ICD-10-CM | POA: Insufficient documentation

## 2015-05-28 DIAGNOSIS — R079 Chest pain, unspecified: Secondary | ICD-10-CM | POA: Diagnosis present

## 2015-05-28 DIAGNOSIS — Z79899 Other long term (current) drug therapy: Secondary | ICD-10-CM | POA: Insufficient documentation

## 2015-05-28 LAB — CBC
HCT: 37.9 % — ABNORMAL LOW (ref 40.0–52.0)
Hemoglobin: 12.6 g/dL — ABNORMAL LOW (ref 13.0–18.0)
MCH: 30.4 pg (ref 26.0–34.0)
MCHC: 33.2 g/dL (ref 32.0–36.0)
MCV: 91.3 fL (ref 80.0–100.0)
PLATELETS: 259 10*3/uL (ref 150–440)
RBC: 4.15 MIL/uL — AB (ref 4.40–5.90)
RDW: 13.3 % (ref 11.5–14.5)
WBC: 9.3 10*3/uL (ref 3.8–10.6)

## 2015-05-28 LAB — BASIC METABOLIC PANEL
Anion gap: 6 (ref 5–15)
BUN: 11 mg/dL (ref 6–20)
CALCIUM: 8.6 mg/dL — AB (ref 8.9–10.3)
CO2: 27 mmol/L (ref 22–32)
CREATININE: 1.2 mg/dL (ref 0.61–1.24)
Chloride: 106 mmol/L (ref 101–111)
GFR calc non Af Amer: >60 mL/min (ref >60)
Glucose, Bld: 94 mg/dL (ref 65–99)
Potassium: 3.4 mmol/L — ABNORMAL LOW (ref 3.5–5.1)
Sodium: 139 mmol/L (ref 135–145)

## 2015-05-28 LAB — TROPONIN I

## 2015-05-28 MED ORDER — IBUPROFEN 400 MG PO TABS
600.0000 mg | ORAL_TABLET | Freq: Once | ORAL | Status: AC
  Administered 2015-05-28: 600 mg via ORAL

## 2015-05-28 MED ORDER — IBUPROFEN 600 MG PO TABS
ORAL_TABLET | ORAL | Status: AC
  Administered 2015-05-28: 600 mg via ORAL
  Filled 2015-05-28: qty 1

## 2015-05-28 NOTE — Discharge Instructions (Signed)
This discomfort does not appear to be cardiac or pulmonary in nature. U have tenderness to the left pectoralis muscle. He also sustained other muscle strains recently. Take ibuprofen, 600-800 mg, 3 times a day. We've discussed adding magnesium, 400-800 mg, once or twice a day, to help with muscle soreness. Dr. Manson Passey had encouraged you to follow-up with Dr. Juliann Pares. This is still a reasonable step. He may also follow-up with a known general physician for ongoing care.  Chest Wall Pain Chest wall pain is pain felt in or around the chest bones and muscles. It may take up to 6 weeks to get better. It may take longer if you are active. Chest wall pain can happen on its own. Other times, things like germs, injury, coughing, or exercise can cause the pain. HOME CARE   Avoid activities that make you tired or cause pain. Try not to use your chest, belly (abdominal), or side muscles. Do not use heavy weights.  Put ice on the sore area.  Put ice in a plastic bag.  Place a towel between your skin and the bag.  Leave the ice on for 15-20 minutes for the first 2 days.  Only take medicine as told by your doctor. GET HELP RIGHT AWAY IF:   You have more pain or are very uncomfortable.  You have a fever.  Your chest pain gets worse.  You have new problems.  You feel sick to your stomach (nauseous) or throw up (vomit).  You start to sweat or feel lightheaded.  You have a cough with mucus (phlegm).  You cough up blood. MAKE SURE YOU:   Understand these instructions.  Will watch your condition.  Will get help right away if you are not doing well or get worse. Document Released: 02/17/2008 Document Revised: 11/23/2011 Document Reviewed: 04/27/2011 [facility] Patient Information 2015 [place], [place]. This information is not intended to replace advice given to you by your health care provider. Make sure you discuss any questions you have with your health care provider.

## 2015-05-28 NOTE — ED Notes (Signed)
Pt ambulatory to room 30 without difficulty or distress noted; pt st left sided CP that began 8pm last night; stayed home from work; awoke at 530am with persistent pain; st pain radiates into back accomp by SOB; was seen here for same recently and given referral for f/u but as of yet has been unable to make appointment; resp even/unlab, lungs clear, apical regular

## 2015-05-28 NOTE — ED Provider Notes (Signed)
[facility] Emergency Department Provider Note  ____________________________________________  Time seen: 9:15 PM  I have reviewed the triage vital signs and the nursing notes.   HISTORY  Chief Complaint Chest Pain     HPI Alfons Sulkowski Gadd is a 24 y.o. male who reports he has left-sided chest pain. This began yesterday. It's worse when he is moving. It hurt some when he takes a deep breath. He reports he can see the left pectoralis muscle rippling at times and he has discomfort when that happens.   The patient has had similar discomfort in the past. He was seen in the emergency department a few weeks ago. At that time he had serial cardiac enzymes performed. Both were negative. He was referred to cardiology due to a heart murmur, but he has not been able to follow-up with them yet.    Past Medical History  Diagnosis Date  . Asthma     There are no active problems to display for this patient.   Past Surgical History  Procedure Laterality Date  . Rotator cuff repair Left     Current Outpatient Rx  Name  Route  Sig  Dispense  Refill  . ibuprofen (ADVIL,MOTRIN) 800 MG tablet   Oral   Take 1 tablet (800 mg total) by mouth every 8 (eight) hours as needed.   15 tablet   0   . ibuprofen (ADVIL,MOTRIN) 800 MG tablet   Oral   Take 1 tablet (800 mg total) by mouth every 8 (eight) hours as needed for moderate pain.   15 tablet   0   . methocarbamol (ROBAXIN-750) 750 MG tablet   Oral   Take 2 tablets (1,500 mg total) by mouth 4 (four) times daily.   40 tablet   0   . pseudoephedrine (SUDAFED) 30 MG tablet   Oral   Take 1 tablet (30 mg total) by mouth every 6 (six) hours as needed for congestion.   24 tablet   0     Allergies Iodine and Shellfish allergy  History reviewed. No pertinent family history.  Social History Social History  Substance Use Topics  . Smoking status: Never Smoker   . Smokeless tobacco: Never Used  . Alcohol Use:  No    Review of Systems  Constitutional: Negative for fever. ENT: Negative for sore throat. Cardiovascular: Positive for chest pain. Respiratory: Negative for shortness of breath. Gastrointestinal: Negative for abdominal pain, vomiting and diarrhea. Genitourinary: Negative for dysuria. Musculoskeletal: Recent muscle strain on the right.. Skin: Negative for rash. Neurological: Negative for headaches   10-point ROS otherwise negative.  ____________________________________________   PHYSICAL EXAM:  VITAL SIGNS: ED Triage Vitals  Enc Vitals Group     BP 05/28/15 1751 125/80 mmHg     Pulse Rate 05/28/15 1751 78     Resp 05/28/15 1751 20     Temp 05/28/15 1751 98.2 F (36.8 C)     Temp Source 05/28/15 1751 Oral     SpO2 05/28/15 1751 100 %     Weight 05/28/15 1751 180 lb (81.647 kg)     Height 05/28/15 1751 6\' 3"  (1.905 m)     Head Cir --      Peak Flow --      Pain Score 05/28/15 1752 7     Pain Loc --      Pain Edu? --      Excl. in GC? --     Constitutional:  Alert and oriented. Well appearing  and in no distress. ENT   Head: Normocephalic and atraumatic.   Nose: No congestion/rhinnorhea. Cardiovascular: Normal rate at 72, regular rhythm, no murmur noted Chest wall: Notable tenderness to the left pectoralis muscle. Some mild discomfort with movement of the left arm. Respiratory:  Normal respiratory effort, no tachypnea.    Breath sounds are clear and equal bilaterally.  Gastrointestinal: Soft and nontender. No distention.  Back: No muscle spasm, no tenderness, no CVA tenderness. Musculoskeletal: No deformity noted. Nontender with normal range of motion in all extremities.  No noted edema. Neurologic:  Normal speech and language. No gross focal neurologic deficits are appreciated.  Skin:  Skin is warm, dry. No rash noted. Psychiatric: Mood and affect are normal. He is calm and appropriate. Speech and behavior are normal.   ____________________________________________    LABS (pertinent positives/negatives) Labs Reviewed  BASIC METABOLIC PANEL - Abnormal; Notable for the following:    Potassium 3.4 (*)    Calcium 8.6 (*)    All other components within normal limits  CBC - Abnormal; Notable for the following:    RBC 4.15 (*)    Hemoglobin 12.6 (*)    HCT 37.9 (*)    All other components within normal limits  TROPONIN I     ____________________________________________   EKG  ED ECG REPORT I, Zailey Audia W, the attending physician, personally viewed and interpreted this ECG.   Date: 05/28/2015  EKG Time: 1750  Rate: 90  Rhythm: Normal sinus rhythm  Axis: Normal  Intervals: Normal  ST&T Change: None noted  ____________________________________________    RADIOLOGY   IMPRESSION: No active cardiopulmonary disease.   ____________________________________________  INITIAL IMPRESSION / ASSESSMENT AND PLAN / ED COURSE  Pertinent labs & imaging results that were available during my care of the patient were reviewed by me and considered in my medical decision making (see chart for details).  Well-appearing pleasant 24 year old male. He appears a little bit anxious but otherwise in no acute distress. She has notable tenderness to the left pectoralis muscle. I believe his discomfort is musculoskeletal. He has no noted cardiac is factors.  We will treat the patient with ibuprofen. I advised that magnesium may help some of the muscle discomfort as well as. I have encouraged him to follow-up with cardiology as she was previously advised, although I think that this is not a cardiac issue. There may be other aspects of his evaluation in August that call for cardiology follow-up, and I think this is reasonable.  ____________________________________________   FINAL CLINICAL IMPRESSION(S) / ED DIAGNOSES  Final diagnoses:  Anterior chest wall pain  Left-sided chest wall pain      Darien Ramus, MD 05/28/15 2134

## 2015-05-28 NOTE — ED Notes (Signed)
Pt to ed with c/o chest pain intermittent and recurrent over the last 2 weeks, was seen here for same and told to follow up but states he is unable to get an appt with cardiologist.  Pt reports when the sharp pain happens he feels dizzy and weak and reports taking a deep breath makes it worse. Denies sob, denies n/v.

## 2015-07-18 ENCOUNTER — Encounter: Payer: Self-pay | Admitting: Emergency Medicine

## 2015-07-18 ENCOUNTER — Emergency Department
Admission: EM | Admit: 2015-07-18 | Discharge: 2015-07-18 | Disposition: A | Discharge status: Home / Self Care | Payer: PRIVATE HEALTH INSURANCE | Attending: Emergency Medicine | Admitting: Emergency Medicine

## 2015-07-18 DIAGNOSIS — Z79899 Other long term (current) drug therapy: Secondary | ICD-10-CM | POA: Insufficient documentation

## 2015-07-18 DIAGNOSIS — J45901 Unspecified asthma with (acute) exacerbation: Secondary | ICD-10-CM | POA: Insufficient documentation

## 2015-07-18 DIAGNOSIS — J209 Acute bronchitis, unspecified: Secondary | ICD-10-CM

## 2015-07-18 DIAGNOSIS — R05 Cough: Secondary | ICD-10-CM | POA: Diagnosis present

## 2015-07-18 DIAGNOSIS — J029 Acute pharyngitis, unspecified: Secondary | ICD-10-CM

## 2015-07-18 MED ORDER — ALBUTEROL SULFATE HFA 108 (90 BASE) MCG/ACT IN AERS: 2.0000 | INHALATION_SPRAY | RESPIRATORY_TRACT | Status: DC | PRN

## 2015-07-18 MED ORDER — PREDNISONE 10 MG PO TABS: 10.0000 mg | ORAL_TABLET | ORAL | Status: DC

## 2015-07-18 MED ORDER — MAGIC MOUTHWASH W/LIDOCAINE: 5.0000 mL | Freq: Four times a day (QID) | ORAL | Status: DC

## 2015-07-18 MED ORDER — AZITHROMYCIN 250 MG PO TABS: ORAL_TABLET | ORAL | Status: DC

## 2015-07-18 MED ORDER — FLUTICASONE PROPIONATE 50 MCG/ACT NA SUSP: 1.0000 | Freq: Two times a day (BID) | NASAL | Status: DC

## 2015-07-18 MED ORDER — CETIRIZINE HCL 10 MG PO TABS: 10.0000 mg | ORAL_TABLET | Freq: Every day | ORAL | Status: DC

## 2015-07-18 NOTE — Discharge Instructions (Signed)
Acute Bronchitis Bronchitis is inflammation of the airways that extend from the windpipe into the lungs (bronchi). The inflammation often causes mucus to develop. This leads to a cough, which is the most common symptom of bronchitis.  In acute bronchitis, the condition usually develops suddenly and goes away over time, usually in a couple weeks. Smoking, allergies, and asthma can make bronchitis worse. Repeated episodes of bronchitis may cause further lung problems.  CAUSES Acute bronchitis is most often caused by the same virus that causes a cold. The virus can spread from person to person (contagious) through coughing, sneezing, and touching contaminated objects. SIGNS AND SYMPTOMS   Cough.   Fever.   Coughing up mucus.   Body aches.   Chest congestion.   Chills.   Shortness of breath.   Sore throat.  DIAGNOSIS  Acute bronchitis is usually diagnosed through a physical exam. Your health care provider will also ask you questions about your medical history. Tests, such as chest X-rays, are sometimes done to rule out other conditions.  TREATMENT  Acute bronchitis usually goes away in a couple weeks. Oftentimes, no medical treatment is necessary. Medicines are sometimes given for relief of fever or cough. Antibiotic medicines are usually not needed but may be prescribed in certain situations. In some cases, an inhaler may be recommended to help reduce shortness of breath and control the cough. A cool mist vaporizer may also be used to help thin bronchial secretions and make it easier to clear the chest.  HOME CARE INSTRUCTIONS  Get plenty of rest.   Drink enough fluids to keep your urine clear or pale yellow (unless you have a medical condition that requires fluid restriction). Increasing fluids may help thin your respiratory secretions (sputum) and reduce chest congestion, and it will prevent dehydration.   Take medicines only as directed by your health care provider.  If  you were prescribed an antibiotic medicine, finish it all even if you start to feel better.  Avoid smoking and secondhand smoke. Exposure to cigarette smoke or irritating chemicals will make bronchitis worse. If you are a smoker, consider using nicotine gum or skin patches to help control withdrawal symptoms. Quitting smoking will help your lungs heal faster.   Reduce the chances of another bout of acute bronchitis by washing your hands frequently, avoiding people with cold symptoms, and trying not to touch your hands to your mouth, nose, or eyes.   Keep all follow-up visits as directed by your health care provider.  SEEK MEDICAL CARE IF: Your symptoms do not improve after 1 week of treatment.  SEEK IMMEDIATE MEDICAL CARE IF:  You develop an increased fever or chills.   You have chest pain.   You have severe shortness of breath.  You have bloody sputum.   You develop dehydration.  You faint or repeatedly feel like you are going to pass out.  You develop repeated vomiting.  You develop a severe headache. MAKE SURE YOU:   Understand these instructions.  Will watch your condition.  Will get help right away if you are not doing well or get worse.   This information is not intended to replace advice given to you by your health care provider. Make sure you discuss any questions you have with your health care provider.   Document Released: 10/08/2004 Document Revised: 09/21/2014 Document Reviewed: 02/21/2013 Elsevier Interactive Patient Education 2016 [employer]  Pharyngitis Pharyngitis is redness, pain, and swelling (inflammation) of your pharynx.  CAUSES  Pharyngitis is usually caused  by infection. Most of the time, these infections are from viruses (viral) and are part of a cold. However, sometimes pharyngitis is caused by bacteria (bacterial). Pharyngitis can also be caused by allergies. Viral pharyngitis may be spread from person to person by coughing, sneezing, and  personal items or utensils (cups, forks, spoons, toothbrushes). Bacterial pharyngitis may be spread from person to person by more intimate contact, such as kissing.  SIGNS AND SYMPTOMS  Symptoms of pharyngitis include:   Sore throat.   Tiredness (fatigue).   Low-grade fever.   Headache.  Joint pain and muscle aches.  Skin rashes.  Swollen lymph nodes.  Plaque-like film on throat or tonsils (often seen with bacterial pharyngitis). DIAGNOSIS  Your health care provider will ask you questions about your illness and your symptoms. Your medical history, along with a physical exam, is often all that is needed to diagnose pharyngitis. Sometimes, a rapid strep test is done. Other lab tests may also be done, depending on the suspected cause.  TREATMENT  Viral pharyngitis will usually get better in 3-4 days without the use of medicine. Bacterial pharyngitis is treated with medicines that kill germs (antibiotics).  HOME CARE INSTRUCTIONS   Drink enough water and fluids to keep your urine clear or pale yellow.   Only take over-the-counter or prescription medicines as directed by your health care provider:   If you are prescribed antibiotics, make sure you finish them even if you start to feel better.   Do not take aspirin.   Get lots of rest.   Gargle with 8 oz of salt water ( tsp of salt per 1 qt of water) as often as every 1-2 hours to soothe your throat.   Throat lozenges (if you are not at risk for choking) or sprays may be used to soothe your throat. SEEK MEDICAL CARE IF:   You have large, tender lumps in your neck.  You have a rash.  You cough up green, yellow-brown, or bloody spit. SEEK IMMEDIATE MEDICAL CARE IF:   Your neck becomes stiff.  You drool or are unable to swallow liquids.  You vomit or are unable to keep medicines or liquids down.  You have severe pain that does not go away with the use of recommended medicines.  You have trouble breathing (not  caused by a stuffy nose). MAKE SURE YOU:   Understand these instructions.  Will watch your condition.  Will get help right away if you are not doing well or get worse.   This information is not intended to replace advice given to you by your health care provider. Make sure you discuss any questions you have with your health care provider.   Document Released: 08/31/2005 Document Revised: 06/21/2013 Document Reviewed: 05/08/2013 Elsevier Interactive Patient Education [employer]

## 2015-07-18 NOTE — ED Notes (Signed)
Productive cough and sinus drainage for 6 days.  Reports some fevers two days ago which have resolved.  States coughing up thick green phlem.

## 2015-07-18 NOTE — ED Provider Notes (Signed)
[facility] Emergency Department Provider Note  ____________________________________________  Time seen: Approximately 12:43 PM  I have reviewed the triage vital signs and the nursing notes.   HISTORY  Chief Complaint Cough    HPI Glenn Acosta is a 24 y.o. male who presents emergency department for sinus congestion, sore throat, cough. He states that symptoms began insidiously, improved after 2-3 days and then have since worsened. States that he does have a history of asthma but has not had an exacerbation in years. He reports at this time that he can hear audible wheezing and the cough has been worsening. He denies shortness of breath or difficulty breathing. He states that there is a sharp sensation with coughing in his chest. He states that his chest occasionally will feel "tight". He does endorse intermittent fevers which have been well controlled with Tylenol or ibuprofen. Dorsal productive cough at this time.   Past Medical History  Diagnosis Date  . Asthma     There are no active problems to display for this patient.   Past Surgical History  Procedure Laterality Date  . Rotator cuff repair Left     Current Outpatient Rx  Name  Route  Sig  Dispense  Refill  . albuterol (PROVENTIL HFA;VENTOLIN HFA) 108 (90 BASE) MCG/ACT inhaler   Inhalation   Inhale 2 puffs into the lungs every 4 (four) hours as needed for wheezing or shortness of breath.   1 Inhaler   0   . azithromycin (ZITHROMAX Z-PAK) 250 MG tablet      Take 2 tablets (500 mg) on  Day 1,  followed by 1 tablet (250 mg) once daily on Days 2 through 5.   6 each   0   . cetirizine (ZYRTEC) 10 MG tablet   Oral   Take 1 tablet (10 mg total) by mouth daily.   30 tablet   0   . fluticasone (FLONASE) 50 MCG/ACT nasal spray   Each Nare   Place 1 spray into both nostrils 2 (two) times daily.   16 g   0   . ibuprofen (ADVIL,MOTRIN) 800 MG tablet   Oral   Take 1 tablet (800 mg total)  by mouth every 8 (eight) hours as needed.   15 tablet   0   . ibuprofen (ADVIL,MOTRIN) 800 MG tablet   Oral   Take 1 tablet (800 mg total) by mouth every 8 (eight) hours as needed for moderate pain.   15 tablet   0   . magic mouthwash w/lidocaine SOLN   Oral   Take 5 mLs by mouth 4 (four) times daily.   240 mL   0     Dispense in a 1/1/1/1 ratio   . methocarbamol (ROBAXIN-750) 750 MG tablet   Oral   Take 2 tablets (1,500 mg total) by mouth 4 (four) times daily.   40 tablet   0   . predniSONE (DELTASONE) 10 MG tablet   Oral   Take 1 tablet (10 mg total) by mouth as directed.   21 tablet   0     Take on a daily basis of 6, 5, 4, 3, 2, 1   . pseudoephedrine (SUDAFED) 30 MG tablet   Oral   Take 1 tablet (30 mg total) by mouth every 6 (six) hours as needed for congestion.   24 tablet   0     Allergies Iodine and Shellfish allergy  No family history on file.  Social History  Social History  Substance Use Topics  . Smoking status: Never Smoker   . Smokeless tobacco: Never Used  . Alcohol Use: No    Review of Systems Constitutional: No fever/chills Eyes: No visual changes. ENT: Endorses sore throat. Endorses nasal congestion. Cardiovascular: Denies chest pain. Respiratory: Denies shortness of breath. Endorses cough. Gastrointestinal: No abdominal pain.  No nausea, no vomiting.  No diarrhea.  No constipation. Genitourinary: Negative for dysuria. Musculoskeletal: Negative for back pain. Skin: Negative for rash. Neurological: Negative for headaches, focal weakness or numbness.  10-point ROS otherwise negative.  ____________________________________________   PHYSICAL EXAM:  VITAL SIGNS: ED Triage Vitals  Enc Vitals Group     BP 07/18/15 1218 123/64 mmHg     Pulse Rate 07/18/15 1218 65     Resp 07/18/15 1218 16     Temp 07/18/15 1218 98.1 F (36.7 C)     Temp Source 07/18/15 1218 Oral     SpO2 07/18/15 1218 98 %     Weight 07/18/15 1218 190 lb  (86.183 kg)     Height 07/18/15 1218  (1.905 m)     Head Cir --      Peak Flow --      Pain Score 07/18/15 1221 3     Pain Loc --      Pain Edu? --      Excl. in GC? --     Constitutional: Alert and oriented. Well appearing and in no acute distress. Eyes: Conjunctivae are normal. PERRL. EOMI. Head: Atraumatic. Nose: Moderate clear congestion/rhinnorhea. Mouth/Throat: Mucous membranes are moist.  Oropharynx mildly erythematous. Postnasal drip identified in oropharynx. Neck: No stridor.   Hematological/Lymphatic/Immunilogical: Diffuse, nontender, mobile anterior cervical lymphadenopathy. Cardiovascular: Normal rate, regular rhythm. Grossly normal heart sounds.  Good peripheral circulation. Respiratory: Normal respiratory effort.  No retractions. Diffuse wheezing bilateral lung fields. No decreased breath sounds are absent breath sounds. No rhonchi. Gastrointestinal: Soft and nontender. No distention. No abdominal bruits. No CVA tenderness. Musculoskeletal: No lower extremity tenderness nor edema.  No joint effusions. Neurologic:  Normal speech and language. No gross focal neurologic deficits are appreciated. No gait instability. Skin:  Skin is warm, dry and intact. No rash noted. Psychiatric: Mood and affect are normal. Speech and behavior are normal.  ____________________________________________   LABS (all labs ordered are listed, but only abnormal results are displayed)  Labs Reviewed - No data to display ____________________________________________  EKG   ____________________________________________  RADIOLOGY   ____________________________________________   PROCEDURES  Procedure(s) performed: None  Critical Care performed: No  ____________________________________________   INITIAL IMPRESSION / ASSESSMENT AND PLAN / ED COURSE  Pertinent labs & imaging results that were available during my care of the patient were reviewed by me and considered in my  medical decision making (see chart for details).  Patient's history, symptoms, physical exam are taken and consideration for diagnosis. The patient initially had symptoms consistent with a viral respiratory infection. Patient slightly improved and then worsened which is suggestive of secondary bacterial infection. At this time the patient's diagnosis is most consistent with bronchitis. Advised the patient of findings and diagnosis and he verbalizes understanding of same. The patient will be provided with antibiotics, albuterol, steroids, and symptomatic medications. Patient verbalizes understanding of the treatment plan and verbalizes compliance with same. ____________________________________________   FINAL CLINICAL IMPRESSION(S) / ED DIAGNOSES  Final diagnoses:  Acute bronchitis, unspecified organism  Pharyngitis      Racheal Patches, PA-C 07/18/15 1312  Jene Every, MD 07/18/15 [phone]

## 2015-10-22 ENCOUNTER — Encounter: Payer: Self-pay | Admitting: Emergency Medicine

## 2015-10-22 ENCOUNTER — Emergency Department: Payer: PRIVATE HEALTH INSURANCE

## 2015-10-22 ENCOUNTER — Emergency Department
Admission: EM | Admit: 2015-10-22 | Discharge: 2015-10-22 | Disposition: A | Discharge status: Home / Self Care | Payer: Self-pay | Attending: Emergency Medicine | Admitting: Emergency Medicine

## 2015-10-22 DIAGNOSIS — W2209XA Striking against other stationary object, initial encounter: Secondary | ICD-10-CM | POA: Insufficient documentation

## 2015-10-22 DIAGNOSIS — Y9389 Activity, other specified: Secondary | ICD-10-CM | POA: Insufficient documentation

## 2015-10-22 DIAGNOSIS — Z79899 Other long term (current) drug therapy: Secondary | ICD-10-CM | POA: Insufficient documentation

## 2015-10-22 DIAGNOSIS — Z792 Long term (current) use of antibiotics: Secondary | ICD-10-CM | POA: Insufficient documentation

## 2015-10-22 DIAGNOSIS — Y998 Other external cause status: Secondary | ICD-10-CM | POA: Insufficient documentation

## 2015-10-22 DIAGNOSIS — Z7952 Long term (current) use of systemic steroids: Secondary | ICD-10-CM | POA: Insufficient documentation

## 2015-10-22 DIAGNOSIS — S92251A Displaced fracture of navicular [scaphoid] of right foot, initial encounter for closed fracture: Secondary | ICD-10-CM

## 2015-10-22 DIAGNOSIS — Y9289 Other specified places as the place of occurrence of the external cause: Secondary | ICD-10-CM | POA: Insufficient documentation

## 2015-10-22 DIAGNOSIS — S92254A Nondisplaced fracture of navicular [scaphoid] of right foot, initial encounter for closed fracture: Secondary | ICD-10-CM | POA: Insufficient documentation

## 2015-10-22 MED ORDER — OXYCODONE-ACETAMINOPHEN 5-325 MG PO TABS: 1.0000 | ORAL_TABLET | ORAL | Status: DC | PRN

## 2015-10-22 NOTE — ED Notes (Signed)
States he hit a coffee table on Sunday night  Having pain at top of foot into ankle   Increased pain with ambulation

## 2015-10-22 NOTE — ED Notes (Signed)
See

## 2015-10-22 NOTE — ED Provider Notes (Signed)
[facility] Emergency Department Provider Note  ____________________________________________  Time seen: Approximately 11:19 AM  I have reviewed the triage vital signs and the nursing notes.   HISTORY  Chief Complaint Ankle Pain    HPI Glenn Acosta is a 25 y.o. male presents for evaluation of right foot pain. Glenn Acosta states that he kicked a coffee table 2 days ago. Planes of the dorsal medial foot pain. No ecchymosis or bruising noted.   Past Medical History  Diagnosis Date  . Asthma     There are no active problems to display for this Glenn Acosta.   Past Surgical History  Procedure Laterality Date  . Rotator cuff repair Left     Current Outpatient Rx  Name  Route  Sig  Dispense  Refill  . albuterol (PROVENTIL HFA;VENTOLIN HFA) 108 (90 BASE) MCG/ACT inhaler   Inhalation   Inhale 2 puffs into the lungs every 4 (four) hours as needed for wheezing or shortness of breath.   1 Inhaler   0   . azithromycin (ZITHROMAX Z-PAK) 250 MG tablet      Take 2 tablets (500 mg) on  Day 1,  followed by 1 tablet (250 mg) once daily on Days 2 through 5.   6 each   0   . cetirizine (ZYRTEC) 10 MG tablet   Oral   Take 1 tablet (10 mg total) by mouth daily.   30 tablet   0   . fluticasone (FLONASE) 50 MCG/ACT nasal spray   Each Nare   Place 1 spray into both nostrils 2 (two) times daily.   16 g   0   . ibuprofen (ADVIL,MOTRIN) 800 MG tablet   Oral   Take 1 tablet (800 mg total) by mouth every 8 (eight) hours as needed.   15 tablet   0   . ibuprofen (ADVIL,MOTRIN) 800 MG tablet   Oral   Take 1 tablet (800 mg total) by mouth every 8 (eight) hours as needed for moderate pain.   15 tablet   0   . magic mouthwash w/lidocaine SOLN   Oral   Take 5 mLs by mouth 4 (four) times daily.   240 mL   0     Dispense in a 1/1/1/1 ratio   . methocarbamol (ROBAXIN-750) 750 MG tablet   Oral   Take 2 tablets (1,500 mg total) by mouth 4 (four) times daily.  40 tablet   0   . oxyCODONE-acetaminophen (ROXICET) 5-325 MG tablet   Oral   Take 1-2 tablets by mouth every 4 (four) hours as needed for severe pain.   20 tablet   0   . predniSONE (DELTASONE) 10 MG tablet   Oral   Take 1 tablet (10 mg total) by mouth as directed.   21 tablet   0     Take on a daily basis of 6, 5, 4, 3, 2, 1   . pseudoephedrine (SUDAFED) 30 MG tablet   Oral   Take 1 tablet (30 mg total) by mouth every 6 (six) hours as needed for congestion.   24 tablet   0     Allergies Iodine and Shellfish allergy  No family history on file.  Social History Social History  Substance Use Topics  . Smoking status: Never Smoker   . Smokeless tobacco: Never Used  . Alcohol Use: No    Review of Systems Constitutional: No fever/chills Eyes: No visual changes. ENT: No sore throat. Cardiovascular: Denies chest pain. Respiratory:  Denies shortness of breath. Gastrointestinal: No abdominal pain.  No nausea, no vomiting.  No diarrhea.  No constipation. Genitourinary: Negative for dysuria. Musculoskeletal: Positive for right foot pain. Skin: Negative for rash. Neurological: Negative for headaches, focal weakness or numbness.  10-point ROS otherwise negative.  ____________________________________________   PHYSICAL EXAM:  VITAL SIGNS: ED Triage Vitals  Enc Vitals Group     BP 10/22/15 1050 138/79 mmHg     Pulse Rate 10/22/15 1050 73     Resp 10/22/15 1050 18     Temp 10/22/15 1050 98 F (36.7 C)     Temp Source 10/22/15 1050 Oral     SpO2 10/22/15 1050 100 %     Weight 10/22/15 1050 190 lb (86.183 kg)     Height 10/22/15 1050  (1.905 m)     Head Cir --      Peak Flow --      Pain Score --      Pain Loc --      Pain Edu? --      Excl. in GC? --     Constitutional: Alert and oriented. Well appearing and in no acute distress.   Cardiovascular: Normal rate, regular rhythm. Grossly normal heart sounds.  Good peripheral circulation. Respiratory:  Normal respiratory effort.  No retractions. Lungs CTAB. Musculoskeletal: No lower extremity tenderness nor edema.  No joint effusions. Positive for dorsal medial pain. Neurologic:  Normal speech and language. No gross focal neurologic deficits are appreciated. No gait instability. Skin:  Skin is warm, dry and intact. No rash noted. Psychiatric: Mood and affect are normal. Speech and behavior are normal.  ____________________________________________   LABS (all labs ordered are listed, but only abnormal results are displayed)  Labs Reviewed - No data to display  RADIOLOGY  FINDINGS: Subtle nondisplaced avulsion fracture noted from the dorsal margin of the navicular.  No other evidence of a fracture. Joints are normally aligned. There is bony prominence from the medial first metatarsal head consistent with a bunion.  Soft tissues are unremarkable.  IMPRESSION: Small nondisplaced avulsion fracture from the dorsal margin of the navicular. ____________________________________________   PROCEDURES  Procedure(s) performed: None  Critical Care performed: No  ____________________________________________   INITIAL IMPRESSION / ASSESSMENT AND PLAN / ED COURSE  Pertinent labs & imaging results that were available during my care of the Glenn Acosta were reviewed by me and considered in my medical decision making (see chart for details).  Nondisplaced avulsion fracture of the navicular bone. Rx given for Percocet 5/325 and referral Dr. Hyacinth Meeker. Postop shoe and crutches provided to the Glenn Acosta. He voices no other emergency medical complaints at this time. ____________________________________________   FINAL CLINICAL IMPRESSION(S) / ED DIAGNOSES  Final diagnoses:  Navicular fracture, right, closed, initial encounter     This chart was dictated using voice recognition software/Dragon. Despite best efforts to proofread, errors can occur which can change the meaning. Any change was  purely unintentional.   Evangeline Dakin, PA-C 10/22/15 1210  Charmayne Sheer Yaniv Lage, PA-C 10/22/15 1610  Emily Filbert, MD 10/22/15 [phone]

## 2016-02-21 ENCOUNTER — Emergency Department
Admission: EM | Admit: 2016-02-21 | Discharge: 2016-02-21 | Disposition: A | Discharge status: Home / Self Care | Payer: Self-pay | Attending: Emergency Medicine | Admitting: Emergency Medicine

## 2016-02-21 ENCOUNTER — Encounter: Payer: Self-pay | Admitting: Emergency Medicine

## 2016-02-21 DIAGNOSIS — Y939 Activity, unspecified: Secondary | ICD-10-CM | POA: Insufficient documentation

## 2016-02-21 DIAGNOSIS — Z79899 Other long term (current) drug therapy: Secondary | ICD-10-CM | POA: Insufficient documentation

## 2016-02-21 DIAGNOSIS — S92251A Displaced fracture of navicular [scaphoid] of right foot, initial encounter for closed fracture: Secondary | ICD-10-CM | POA: Insufficient documentation

## 2016-02-21 DIAGNOSIS — J45909 Unspecified asthma, uncomplicated: Secondary | ICD-10-CM | POA: Insufficient documentation

## 2016-02-21 DIAGNOSIS — X58XXXA Exposure to other specified factors, initial encounter: Secondary | ICD-10-CM | POA: Insufficient documentation

## 2016-02-21 DIAGNOSIS — Z7951 Long term (current) use of inhaled steroids: Secondary | ICD-10-CM | POA: Insufficient documentation

## 2016-02-21 DIAGNOSIS — Y999 Unspecified external cause status: Secondary | ICD-10-CM | POA: Insufficient documentation

## 2016-02-21 DIAGNOSIS — Y929 Unspecified place or not applicable: Secondary | ICD-10-CM | POA: Insufficient documentation

## 2016-02-21 MED ORDER — NAPROXEN 500 MG PO TABS: 500.0000 mg | ORAL_TABLET | Freq: Two times a day (BID) | ORAL | Status: DC

## 2016-02-21 NOTE — ED Notes (Signed)
C/O right foot swelling, pain, and redness on top of foot.  Has history of broken foot in February and seen through ED. Did not have any ortho follow up.

## 2016-02-21 NOTE — Discharge Instructions (Signed)
Begin taking naproxen twice a day with food. Call and make an appointment with Dr. Ether GriffinsFowler who is the podiatrist on call and will see you for your foot fracture. Where your podiatry shoe until seen by the podiatrist

## 2016-02-21 NOTE — ED Provider Notes (Signed)
[facility] Emergency Department Provider Note   ____________________________________________  Time seen: Approximately 11:42 AM  I have reviewed the triage vital signs and the nursing notes.   HISTORY  Chief Complaint Foot Pain    HPI Glenn Acosta is a 25 y.o. male is here complaining of right foot pain.She states he had a fracture to his foot in February was seen in the emergency room at that time and told to follow-up with orthopedist. Patient did not make an appointment has never seen an orthopedist since that time. He discontinued wearing his postop shoe. He denies any recent injury to his foot but states that he has began having pain. He took one Tylenol last evening with out complete relief of his pain. Currently he rates his pain as 7/10.   Past Medical History  Diagnosis Date  . Asthma     There are no active problems to display for this patient.   Past Surgical History  Procedure Laterality Date  . Rotator cuff repair Left     Current Outpatient Rx  Name  Route  Sig  Dispense  Refill  . albuterol (PROVENTIL HFA;VENTOLIN HFA) 108 (90 BASE) MCG/ACT inhaler   Inhalation   Inhale 2 puffs into the lungs every 4 (four) hours as needed for wheezing or shortness of breath.   1 Inhaler   0   . cetirizine (ZYRTEC) 10 MG tablet   Oral   Take 1 tablet (10 mg total) by mouth daily.   30 tablet   0   . fluticasone (FLONASE) 50 MCG/ACT nasal spray   Each Nare   Place 1 spray into both nostrils 2 (two) times daily.   16 g   0   . naproxen (NAPROSYN) 500 MG tablet   Oral   Take 1 tablet (500 mg total) by mouth 2 (two) times daily with a meal.   40 tablet   0     Allergies Iodine and Shellfish allergy  No family history on file.  Social History Social History  Substance Use Topics  . Smoking status: Never Smoker   . Smokeless tobacco: Never Used  . Alcohol Use: No    Review of Systems Constitutional: No  fever/chills Cardiovascular: Denies chest pain. Respiratory: Denies shortness of breath. Gastrointestinal: No abdominal pain.  No nausea, no vomiting.  Musculoskeletal: Positive right foot pain. Skin: Negative for rash. Neurological: Negative for focal weakness or numbness.  10-point ROS otherwise negative.  ____________________________________________   PHYSICAL EXAM:  VITAL SIGNS: ED Triage Vitals  Enc Vitals Group     BP 02/21/16 1125 138/86 mmHg     Pulse Rate 02/21/16 1124 54     Resp 02/21/16 1124 16     Temp 02/21/16 1124 98.7 F (37.1 C)     Temp Source 02/21/16 1124 Oral     SpO2 02/21/16 1124 100 %     Weight 02/21/16 1124 190 lb (86.183 kg)     Height 02/21/16 1124 6\' 4"  (1.93 m)     Head Cir --      Peak Flow --      Pain Score 02/21/16 1125 7     Pain Loc --      Pain Edu? --      Excl. in GC? --     Constitutional: Alert and oriented. Well appearing and in no acute distress. Eyes: Conjunctivae are normal. PERRL. EOMI. Head: Atraumatic. Nose: No congestion/rhinnorhea. Neck: No stridor.   Cardiovascular: Normal rate,  regular rhythm. Grossly normal heart sounds.  Good peripheral circulation. Respiratory: Normal respiratory effort.  No retractions. Lungs CTAB. Musculoskeletal: Examination of the right foot there is moderate tenderness on palpation of the dorsum medial area without any gross deformity noted. There is no interruption in the skin and there is no erythema or warmth to touch. Motor sensory function intact. Capillary refill less than 3 seconds. Range of motion is unrestricted but increases patient's pain. Gait was not tested however patient was ambulatory to the exam room. Neurologic:  Normal speech and language. No gross focal neurologic deficits are appreciated.  Skin:  Skin is warm, dry and intact. No rash noted. Psychiatric: Mood and affect are normal. Speech and behavior are normal.  ____________________________________________   LABS (all  labs ordered are listed, but only abnormal results are displayed)  Labs Reviewed - No data to display   PROCEDURES  Procedure(s) performed: None  Critical Care performed: No  ____________________________________________   INITIAL IMPRESSION / ASSESSMENT AND PLAN / ED COURSE  Pertinent labs & imaging results that were available during my care of the patient were reviewed by me and considered in my medical decision making (see chart for details).  Patient is encouraged make an appointment with Dr. Ether Griffins who is the podiatrist on call today. Patient was given a prescription for naproxen 500 mg twice a day with food. Patient is also given a note that her orthopedic shoe to continue wearing until he is seen by the podiatry department. ____________________________________________   FINAL CLINICAL IMPRESSION(S) / ED DIAGNOSES  Final diagnoses:  Fracture of navicular bone of right foot, closed, initial encounter      NEW MEDICATIONS STARTED DURING THIS VISIT:  Discharge Medication List as of 02/21/2016 12:07 PM    START taking these medications   Details  naproxen (NAPROSYN) 500 MG tablet Take 1 tablet (500 mg total) by mouth 2 (two) times daily with a meal., Starting 02/21/2016, Until Discontinued, Print         Note:  This document was prepared using Dragon voice recognition software and may include unintentional dictation errors.    Tommi Rumps, PA-C 02/21/16 1213  Jeanmarie Plant, MD 02/21/16 1538

## 2016-09-10 ENCOUNTER — Encounter: Payer: Self-pay | Admitting: Emergency Medicine

## 2016-09-10 ENCOUNTER — Emergency Department
Admission: EM | Admit: 2016-09-10 | Discharge: 2016-09-10 | Disposition: A | Discharge status: Home / Self Care | Payer: Self-pay | Attending: Emergency Medicine | Admitting: Emergency Medicine

## 2016-09-10 ENCOUNTER — Emergency Department: Payer: Self-pay

## 2016-09-10 DIAGNOSIS — Y999 Unspecified external cause status: Secondary | ICD-10-CM | POA: Insufficient documentation

## 2016-09-10 DIAGNOSIS — Y929 Unspecified place or not applicable: Secondary | ICD-10-CM | POA: Insufficient documentation

## 2016-09-10 DIAGNOSIS — Y9361 Activity, american tackle football: Secondary | ICD-10-CM | POA: Insufficient documentation

## 2016-09-10 DIAGNOSIS — W1839XA Other fall on same level, initial encounter: Secondary | ICD-10-CM | POA: Insufficient documentation

## 2016-09-10 DIAGNOSIS — M25511 Pain in right shoulder: Secondary | ICD-10-CM | POA: Insufficient documentation

## 2016-09-10 DIAGNOSIS — Z791 Long term (current) use of non-steroidal anti-inflammatories (NSAID): Secondary | ICD-10-CM | POA: Insufficient documentation

## 2016-09-10 DIAGNOSIS — Z79899 Other long term (current) drug therapy: Secondary | ICD-10-CM | POA: Insufficient documentation

## 2016-09-10 DIAGNOSIS — J45909 Unspecified asthma, uncomplicated: Secondary | ICD-10-CM | POA: Insufficient documentation

## 2016-09-10 MED ORDER — IBUPROFEN 600 MG PO TABS: 600.0000 mg | ORAL_TABLET | Freq: Four times a day (QID) | ORAL | 0 refills | Status: DC | PRN

## 2016-09-10 MED ORDER — OXYCODONE-ACETAMINOPHEN 5-325 MG PO TABS: 1.0000 | ORAL_TABLET | Freq: Four times a day (QID) | ORAL | 0 refills | Status: DC | PRN

## 2016-09-10 MED ORDER — OXYCODONE-ACETAMINOPHEN 5-325 MG PO TABS
2.0000 | ORAL_TABLET | Freq: Once | ORAL | Status: AC
  Administered 2016-09-10: 2 via ORAL
  Filled 2016-09-10: qty 2

## 2016-09-10 MED ORDER — HYDROMORPHONE HCL 1 MG/ML IJ SOLN
INTRAMUSCULAR | Status: AC
  Administered 2016-09-10: 1 mg via INTRAMUSCULAR
  Filled 2016-09-10: qty 1

## 2016-09-10 MED ORDER — HYDROMORPHONE HCL 1 MG/ML IJ SOLN
1.0000 mg | Freq: Once | INTRAMUSCULAR | Status: AC
  Administered 2016-09-10: 1 mg via INTRAMUSCULAR

## 2016-09-10 NOTE — ED Notes (Signed)
Pt c/o right shoulder pain - MD Paduchowski has evaluated pt and placed orders

## 2016-09-10 NOTE — ED Provider Notes (Signed)
[facility] Emergency Department Provider Note  Time seen: 3:34 PM  I have reviewed the triage vital signs and the nursing notes.   HISTORY  Chief Complaint Shoulder Pain    HPI Glenn Acosta is a 25 y.o. male with no past medical history who presents to the emergency department with right shoulder pain. According to the patient he was playing football when he fell onto his right arm. States he felt a "tear" in his right shoulder. States a history of a left shoulder rotator cuff surgery in the past. Patient states 10/10 pain currently, aching area and lying in bed in moderate distress due to pain. Denies any other injuries. Denies any head injury or LOC.  Past Medical History:  Diagnosis Date  . Asthma     There are no active problems to display for this patient.   Past Surgical History:  Procedure Laterality Date  . ROTATOR CUFF REPAIR Left     Prior to Admission medications   Medication Sig Start Date End Date Taking? Authorizing Provider  albuterol (PROVENTIL HFA;VENTOLIN HFA) 108 (90 BASE) MCG/ACT inhaler Inhale 2 puffs into the lungs every 4 (four) hours as needed for wheezing or shortness of breath. 07/18/15   Delorise RoyalsJonathan D Cuthriell, PA-C  cetirizine (ZYRTEC) 10 MG tablet Take 1 tablet (10 mg total) by mouth daily. 07/18/15   Delorise RoyalsJonathan D Cuthriell, PA-C  fluticasone (FLONASE) 50 MCG/ACT nasal spray Place 1 spray into both nostrils 2 (two) times daily. 07/18/15   Delorise RoyalsJonathan D Cuthriell, PA-C  naproxen (NAPROSYN) 500 MG tablet Take 1 tablet (500 mg total) by mouth 2 (two) times daily with a meal. 02/21/16   Tommi Rumpshonda L Summers, PA-C    Allergies  Allergen Reactions  . Iodine Hives  . Shellfish Allergy Hives    History reviewed. No pertinent family history.  Social History Social History  Substance Use Topics  . Smoking status: Never Smoker  . Smokeless tobacco: Never Used  . Alcohol use No    Review of Systems Constitutional: Negative for  fever. Cardiovascular: Negative for chest pain. Respiratory: Negative for shortness of breath. Gastrointestinal: Negative for abdominal pain Musculoskeletal:Positive for right shoulder pain. Neurological: Negative for headache 10-point ROS otherwise negative.  ____________________________________________   PHYSICAL EXAM:  VITAL SIGNS: ED Triage Vitals [09/10/16 1518]  Enc Vitals Group     BP (!) 161/94     Pulse Rate 79     Resp 20     Temp 98.2 F (36.8 C)     Temp Source Oral     SpO2 100 %     Weight 190 lb (86.2 kg)     Height      Head Circumference      Peak Flow      Pain Score 10     Pain Loc      Pain Edu?      Excl. in GC?    Constitutional: Alert and oriented. Mild to moderate distress due to pain. Eyes: Normal exam ENT   Head: Normocephalic and atraumatic   Mouth/Throat: Mucous membranes are moist. Cardiovascular: Normal rate, regular rhythm. No murmur Respiratory: Normal respiratory effort without tachypnea nor retractions. Breath sounds are clear  Gastrointestinal: Soft and nontender. No distention Musculoskeletal: Moderate right shoulder tenderness palpation, no obvious deformity noted. Neurovascular intact distally with 2+ radial pulse and good grip strength. Neurologic:  Normal speech and language. No gross focal neurologic deficits Skin:  Skin is warm, dry and intact.  Psychiatric: Mood  and affect are normal.   ____________________________________________     RADIOLOGY  X-ray negative  ____________________________________________   INITIAL IMPRESSION / ASSESSMENT AND PLAN / ED COURSE  Pertinent labs & imaging results that were available during my care of the patient were reviewed by me and considered in my medical decision making (see chart for details).  The patient presents the emergency department after traumatic fall with right shoulder pain. Patient has a history of a left shoulder rotator cuff injury and repair. States it  feels somewhat similar. We will treat pain with IM Dilaudid, obtain a x-ray to further evaluate.  X-ray negative suspect likely rotator cuff injury. We will discharge with orthopedic follow-up Percocet in place the arm in a sling for comfort.  ____________________________________________   FINAL CLINICAL IMPRESSION(S) / ED DIAGNOSES  Right shoulder pain    Minna AntisKevin Pheng Prokop, MD 09/10/16 [phone]

## 2016-09-10 NOTE — Discharge Instructions (Signed)
Please call the number to follow-up with orthopedics to arrange a follow-up appointment as soon as possible. Please take your pain medication as described. You may also use an ice pack for 20-30 minutes every 2 hours for discomfort.

## 2016-09-10 NOTE — ED Triage Notes (Signed)
Pt to ed with c/o right shoulder pain after playing tackle football,  Deformity noted to right shoulder.

## 2017-11-03 ENCOUNTER — Emergency Department
Admission: EM | Admit: 2017-11-03 | Discharge: 2017-11-03 | Disposition: A | Discharge status: Home / Self Care | Payer: Self-pay | Attending: Emergency Medicine | Admitting: Emergency Medicine

## 2017-11-03 ENCOUNTER — Other Ambulatory Visit: Payer: Self-pay

## 2017-11-03 ENCOUNTER — Emergency Department: Payer: Self-pay

## 2017-11-03 DIAGNOSIS — R059 Cough, unspecified: Secondary | ICD-10-CM

## 2017-11-03 DIAGNOSIS — R05 Cough: Secondary | ICD-10-CM | POA: Insufficient documentation

## 2017-11-03 DIAGNOSIS — J45909 Unspecified asthma, uncomplicated: Secondary | ICD-10-CM | POA: Insufficient documentation

## 2017-11-03 DIAGNOSIS — J020 Streptococcal pharyngitis: Secondary | ICD-10-CM | POA: Insufficient documentation

## 2017-11-03 DIAGNOSIS — R0602 Shortness of breath: Secondary | ICD-10-CM | POA: Insufficient documentation

## 2017-11-03 DIAGNOSIS — R079 Chest pain, unspecified: Secondary | ICD-10-CM | POA: Insufficient documentation

## 2017-11-03 LAB — CBC
HCT: 42.3 % (ref 40.0–52.0)
Hemoglobin: 14.2 g/dL (ref 13.0–18.0)
MCH: 30.9 pg (ref 26.0–34.0)
MCHC: 33.5 g/dL (ref 32.0–36.0)
MCV: 92.2 fL (ref 80.0–100.0)
PLATELETS: 267 10*3/uL (ref 150–440)
RBC: 4.58 MIL/uL (ref 4.40–5.90)
RDW: 13.7 % (ref 11.5–14.5)
WBC: 8.3 10*3/uL (ref 3.8–10.6)

## 2017-11-03 LAB — BASIC METABOLIC PANEL
Anion gap: 10 (ref 5–15)
BUN: 17 mg/dL (ref 6–20)
CHLORIDE: 105 mmol/L (ref 101–111)
CO2: 23 mmol/L (ref 22–32)
CREATININE: 1.2 mg/dL (ref 0.61–1.24)
Calcium: 9 mg/dL (ref 8.9–10.3)
GFR calc Af Amer: >60 mL/min (ref >60)
GFR calc non Af Amer: >60 mL/min (ref >60)
Glucose, Bld: 84 mg/dL (ref 65–99)
Potassium: 3.5 mmol/L (ref 3.5–5.1)
Sodium: 138 mmol/L (ref 135–145)

## 2017-11-03 LAB — TROPONIN I: Troponin I: <0.03 ng/mL (ref <0.03)

## 2017-11-03 LAB — GROUP A STREP BY PCR: Group A Strep by PCR: DETECTED — AB

## 2017-11-03 MED ORDER — BENZONATATE 100 MG PO CAPS: 100.0000 mg | ORAL_CAPSULE | Freq: Four times a day (QID) | ORAL | 0 refills | Status: DC | PRN

## 2017-11-03 MED ORDER — LIDOCAINE VISCOUS 2 % MT SOLN
15.0000 mL | Freq: Once | OROMUCOSAL | Status: AC
  Administered 2017-11-03: 15 mL via OROMUCOSAL
  Filled 2017-11-03: qty 15

## 2017-11-03 MED ORDER — PENICILLIN G BENZATHINE & PROC 1200000 UNIT/2ML IM SUSP
1.2000 10*6.[IU] | Freq: Once | INTRAMUSCULAR | Status: AC
Start: 2017-11-03 — End: 2017-11-03
  Administered 2017-11-03: 1.2 10*6.[IU] via INTRAMUSCULAR
  Filled 2017-11-03: qty 2

## 2017-11-03 MED ORDER — KETOROLAC TROMETHAMINE 10 MG PO TABS
10.0000 mg | ORAL_TABLET | Freq: Once | ORAL | Status: AC
Start: 2017-11-03 — End: 2017-11-03
  Administered 2017-11-03: 10 mg via ORAL
  Filled 2017-11-03 (×2): qty 1

## 2017-11-03 MED ORDER — MAGIC MOUTHWASH: 15.0000 mL | Freq: Four times a day (QID) | ORAL | 0 refills | Status: DC | PRN

## 2017-11-03 NOTE — ED Notes (Signed)
Pt left while this RN was in another room before e-signing

## 2017-11-03 NOTE — Discharge Instructions (Addendum)
Please drink plenty of fluids to stay well-hydrated.  You may take Tylenol or Motrin for body aches or fever.  Tessalon Perles are for cough.  Return to the emergency department if you develop severe pain, shortness of breath, difficulty swallowing, hoarse voice, drooling, or any other symptoms concerning to you.

## 2017-11-03 NOTE — ED Triage Notes (Signed)
He arrives today with reports of mid sternal chest pain with shortness of breath  Pt reports beginning to feel bad a couple of days ago  - cough, congestion with productive cough with thick, yellow sputum   Hx heart murmur

## 2017-11-03 NOTE — ED Provider Notes (Signed)
[facility] Emergency Department Provider Note  ____________________________________________  Time seen: Approximately 8:12 PM  I have reviewed the triage vital signs and the nursing notes.   HISTORY  Chief Complaint Chest Pain    HPI Glenn Acosta is a 27 y.o. male with a history of asthma presenting with cough, sore throat, chest pain and shortness of breath, generalized malaise.  The patient reports that for the past several days, he has had congestion with a significant cough.  He now has developed a chest pain in the center of his chest and under the left breast which is worse when he pushes on it or coughs.  He has not had any sick contacts.  No lightheadedness or syncope; no lower externally swelling or calf pain.  He has not tried anything for pain but has tried Mucinex for his congestion which has only led to a small improvement.  No nausea vomiting diarrhea or abdominal pain.  Past Medical History:  Diagnosis Date  . Asthma     There are no active problems to display for this patient.   Past Surgical History:  Procedure Laterality Date  . ROTATOR CUFF REPAIR Left     Current Outpatient Rx  . Order #: [id number] Class: Print  . Order #: [id number] Class: Print  . Order #: [id number] Class: Print  . Order #: [id number] Class: Print  . Order #: [id number] Class: Print  . Order #: [id number] Class: Print  . Order #: [id number] Class: Print  . Order #: [id number] Class: Print    Allergies Iodine and Shellfish allergy  No family history on file.  Social History Social History   Tobacco Use  . Smoking status: Never Smoker  . Smokeless tobacco: Never Used  Substance Use Topics  . Alcohol use: No  . Drug use: No    Review of Systems Constitutional: No fever/chills.  No lightheadedness or syncope.  Positive general malaise. Eyes: No visual changes.  Discharge. ENT: Positive sore throat.  Positive congestion positive rhinorrhea.  No ear  pain. Cardiovascular: Positive chest pain. Denies palpitations. Respiratory: Positive shortness of breath.  Positive cough productive of yellow thick sputum. Gastrointestinal: No abdominal pain.  No nausea, no vomiting.  No diarrhea.  No constipation. Genitourinary: Negative for dysuria. Musculoskeletal: Negative for back pain. Skin: Negative for rash. Neurological: Negative for headaches. No focal numbness, tingling or weakness.     ____________________________________________   PHYSICAL EXAM:  VITAL SIGNS: ED Triage Vitals  Enc Vitals Group     BP 11/03/17 1751 136/77     Pulse Rate 11/03/17 1751 (!) 58     Resp 11/03/17 1751 18     Temp 11/03/17 1751 98.4 F (36.9 C)     Temp Source 11/03/17 1751 Oral     SpO2 11/03/17 1751 100 %     Weight 11/03/17 1751 190 lb (86.2 kg)     Height 11/03/17 1751 6\' 3"  (1.905 m)     Head Circumference --      Peak Flow --      Pain Score 11/03/17 1801 9     Pain Loc --      Pain Edu? --      Excl. in GC? --     Constitutional: Alert and oriented.Answers questions appropriately.  Mildly uncomfortable appearing but nontoxic. Eyes: Conjunctivae are normal.  EOMI. No scleral icterus.  No eye discharge. Head: Atraumatic. Nose: No congestion/rhinnorhea. Mouth/Throat: Mucous membranes are moist.  No drooling or trismus.  No hoarse voice.  The patient has erythema  in the posterior pharynx with tonsillar swelling and white exudate bilaterally.  The palate is symmetric and the uvula is midline. Neck: No stridor.  Supple.  No JVD.  No meningismus. Cardiovascular: Normal rate, regular rhythm. No murmurs, rubs or gallops.  The patient does have reproducible tenderness to palpation over the sternum and on the lower anterior thoracic rib cage without any rib instability, crepitus. Respiratory: Normal respiratory effort.  No accessory muscle use or retractions. Lungs CTAB.  No wheezes, rales or ronchi. Gastrointestinal: Soft, nontender and nondistended.   No guarding or rebound.  No peritoneal signs. Musculoskeletal: No LE edema. No ttp in the calves or palpable cords.  Negative Homan's sign. Neurologic:  A&Ox3.  Speech is clear.  Face and smile are symmetric.  EOMI.  Moves all extremities well. Skin:  Skin is warm, dry and intact. No rash noted. Pscyh: Normal mood and affect.  ____________________________________________   LABS (all labs ordered are listed, but only abnormal results are displayed)  Labs Reviewed  GROUP A STREP BY PCR - Abnormal; Notable for the following components:      Result Value   Group A Strep by PCR DETECTED (*)    All other components within normal limits  BASIC METABOLIC PANEL  CBC  TROPONIN I   ____________________________________________  EKG  ED ECG REPORT I, Rockne Menghini, the attending physician, personally viewed and interpreted this ECG.   Date: 11/03/2017  EKG Time: 1748  Rate: 65  Rhythm: normal sinus rhythm  Axis: normal  Intervals:none  ST&T Change: No STEMI  ____________________________________________  RADIOLOGY  Dg Chest 2 View  Result Date: 11/03/2017 CLINICAL DATA:  Chest pain, shortness of breath, and vomiting for 4 days. EXAM: CHEST  2 VIEW COMPARISON:  05/28/2015 FINDINGS: The heart size and mediastinal contours are within normal limits. Both lungs are clear. The visualized skeletal structures are unremarkable. IMPRESSION: Normal study.  No active cardiopulmonary disease. Electronically Signed   By: Myles Rosenthal M.D.   On: 11/03/2017 18:19    ____________________________________________   PROCEDURES  Procedure(s) performed: None  Procedures  Critical Care performed: No ____________________________________________   INITIAL IMPRESSION / ASSESSMENT AND PLAN / ED COURSE  Pertinent labs & imaging results that were available during my care of the patient were reviewed by me and considered in my medical decision making (see chart for details).  27 y.o. male  with a history of asthma presenting with several days of cough and cold symptoms, now with chest pain and shortness of breath.  Overall, the patient is hemodynamically stable and afebrile here.  He does have a some erythema in the posterior pharynx with tonsillar exudate; although he does not meet Centor criteria will evaluate for strep.  His chest x-ray does not show pneumonia and there is no evidence of asthma exacerbation on my evaluation.  The patient does not have a pneumothorax.  He likely has a viral syndrome and will require symptomatic treatment.  Influenza is less likely although if he does have influenza would not change the course of therapy as he has been having symptoms for greater than 24 hours.  Plan reevaluation for final disposition.  ----------------------------------------- 9:35 PM on 11/03/2017 -----------------------------------------  The patient does have strep pharyngitis today, and I will treat him with intramuscular penicillin.  The patient is feeling better at this time, and is safe for discharge home.  Return precautions as well as follow-up instructions were discussed.  ____________________________________________  FINAL CLINICAL IMPRESSION(S) / ED DIAGNOSES  Final diagnoses:  Strep pharyngitis  Chest pain, unspecified type  Cough  Shortness of breath         NEW MEDICATIONS STARTED DURING THIS VISIT:  New Prescriptions   BENZONATATE (TESSALON PERLES) 100 MG CAPSULE    Take 1 capsule (100 mg total) by mouth every 6 (six) hours as needed for cough.   MAGIC MOUTHWASH SOLN    Take 15 mLs by mouth 4 (four) times daily as needed for mouth pain (swish and swallow).      Rockne Menghini, MD 11/03/17 2136

## 2018-02-15 ENCOUNTER — Encounter: Payer: Self-pay | Admitting: Emergency Medicine

## 2018-02-15 ENCOUNTER — Emergency Department
Admission: EM | Admit: 2018-02-15 | Discharge: 2018-02-15 | Disposition: A | Discharge status: Home / Self Care | Payer: Self-pay | Attending: Emergency Medicine | Admitting: Emergency Medicine

## 2018-02-15 DIAGNOSIS — J040 Acute laryngitis: Secondary | ICD-10-CM | POA: Insufficient documentation

## 2018-02-15 DIAGNOSIS — J45909 Unspecified asthma, uncomplicated: Secondary | ICD-10-CM | POA: Insufficient documentation

## 2018-02-15 LAB — GROUP A STREP BY PCR: GROUP A STREP BY PCR: NOT DETECTED

## 2018-02-15 MED ORDER — DEXAMETHASONE SODIUM PHOSPHATE 10 MG/ML IJ SOLN
10.0000 mg | Freq: Once | INTRAMUSCULAR | Status: AC
  Administered 2018-02-15: 10 mg via INTRAMUSCULAR
  Filled 2018-02-15: qty 1

## 2018-02-15 MED ORDER — LIDOCAINE VISCOUS HCL 2 % MT SOLN
15.0000 mL | Freq: Once | OROMUCOSAL | Status: AC
  Administered 2018-02-15: 15 mL via OROMUCOSAL
  Filled 2018-02-15: qty 15

## 2018-02-15 MED ORDER — PREDNISONE 10 MG (21) PO TBPK: ORAL_TABLET | ORAL | 0 refills | Status: DC

## 2018-02-15 MED ORDER — FLUTICASONE PROPIONATE 50 MCG/ACT NA SUSP: 2.0000 | Freq: Every day | NASAL | 0 refills | Status: DC

## 2018-02-15 MED ORDER — BENZONATATE 100 MG PO CAPS: ORAL_CAPSULE | ORAL | 0 refills | Status: DC

## 2018-02-15 NOTE — Discharge Instructions (Signed)
Your strep test was negative. Your symptoms are consistent with a laryngitis. Take the prescription meds as directed. Follow-up with a local community clinic for ongoing symptoms.

## 2018-02-15 NOTE — ED Notes (Signed)
See triage note  Presents with sore throat and hoarseness  States sxs' started 3 days ago  No fever but having increased pain with swallowing

## 2018-02-15 NOTE — ED Triage Notes (Signed)
Patient is complaining of pain with talking x 3 days.  Patient is speaking with soft voice.  Patient reports pain with swallowing.  Patient is maintaining secretions at this time.

## 2018-02-16 NOTE — ED Provider Notes (Signed)
[facility] Emergency Department Provider Note ____________________________________________  Time seen: 1325  I have reviewed the triage vital signs and the nursing notes.  HISTORY  Chief Complaint  Sore Throat  HPI Glenn Acosta is a 27 y.o. male presents to the ED complaining of weak voice and sore throat.  Patient describes symptoms worsen after the onset of a dry, nonproductive cough.  He does note some pain with swallowing.  He denies any fevers, chills, nausea, vomiting, chest pain, or shortness of breath.  He also denies any significant nasal congestion or postnasal drip.  He is without any known sick contacts or other exposures. Patient has been able to tolerate liquids but notes increased discomfort with solid foods.  Past Medical History:  Diagnosis Date  . Asthma     There are no active problems to display for this patient.   Past Surgical History:  Procedure Laterality Date  . ROTATOR CUFF REPAIR Left     Prior to Admission medications   Medication Sig Start Date End Date Taking? Authorizing Provider  benzonatate (TESSALON PERLES) 100 MG capsule Take 1-2 tabs TID prn cough 02/15/18   Arshiya Jakes, Charlesetta Ivory, PA-C  fluticasone (FLONASE) 50 MCG/ACT nasal spray Place 2 sprays into both nostrils daily. 02/15/18   Natania Finigan, Charlesetta Ivory, PA-C  predniSONE (STERAPRED UNI-PAK 21 TAB) 10 MG (21) TBPK tablet 6-day taper as directed. 02/15/18   Devron Cohick, Charlesetta Ivory, PA-C    Allergies Iodine and Shellfish allergy  No family history on file.  Social History Social History   Tobacco Use  . Smoking status: Never Smoker  . Smokeless tobacco: Never Used  Substance Use Topics  . Alcohol use: No  . Drug use: No    Review of Systems  Constitutional: Negative for fever. Eyes: Negative for visual changes. ENT: Positive for sore throat and hoarseness Cardiovascular: Negative for chest pain. Respiratory: Negative for shortness of  breath. Gastrointestinal: Negative for abdominal pain, vomiting and diarrhea. Musculoskeletal: Negative for back pain. Skin: Negative for rash. Neurological: Negative for headaches, focal weakness or numbness. ____________________________________________  PHYSICAL EXAM:  VITAL SIGNS: ED Triage Vitals  Enc Vitals Group     BP 02/15/18 1215 115/76     Pulse Rate 02/15/18 1215 (!) 57     Resp 02/15/18 1215 18     Temp 02/15/18 1215 98.3 F (36.8 C)     Temp Source 02/15/18 1215 Oral     SpO2 02/15/18 1215 100 %     Weight 02/15/18 1216 190 lb (86.2 kg)     Height 02/15/18 1216 6\' 4"  (1.93 m)     Head Circumference --      Peak Flow --      Pain Score 02/15/18 1216 9     Pain Loc --      Pain Edu? --      Excl. in GC? --     Constitutional: Alert and oriented. Well appearing and in no distress. Head: Normocephalic and atraumatic. Eyes: Conjunctivae are normal. PERRL. Normal extraocular movements Ears: Canals clear. TMs intact bilaterally. Nose: No congestion/rhinorrhea/epistaxis. Mouth/Throat: Mucous membranes are moist.  Uvula is midline and tonsils are flat.  No oropharyngeal noted.  No tonsillar exudates are appreciated. Neck: Supple. No thyromegaly. Hematological/Lymphatic/Immunological: No cervical lymphadenopathy. Cardiovascular: Normal rate, regular rhythm. Normal distal pulses. Respiratory: Normal respiratory effort. No wheezes/rales/rhonchi. ____________________________________________   LABS (pertinent positives/negatives)  Labs Reviewed  GROUP A STREP BY PCR  ____________________________________________  PROCEDURES  Procedures Decadron  10 mg IM 2 % viscous lido gargle ____________________________________________  INITIAL IMPRESSION / ASSESSMENT AND PLAN / ED COURSE  Patient with ED evaluation of hoarseness and sore throat.  Patient's strep a PCR is negative at this time for an acute infectious process.  His clinical picture does not support an infectious  process.  He likely dealing with a viral etiology causing his laryngitis.  He will be discharged with a prescription for prednisone, Flonase, and Tessalon Perles.  He will follow-up with 1 of the local community clinics for ongoing symptoms.  Work is provided for today as requested. ____________________________________________  FINAL CLINICAL IMPRESSION(S) / ED DIAGNOSES  Final diagnoses:  Laryngitis      Karmen StabsMenshew, Charlesetta IvoryJenise V Bacon, PA-C 02/16/18 1923    Arnaldo NatalMalinda, Paul F, MD 02/18/18 [phone]

## 2018-04-12 ENCOUNTER — Encounter: Payer: Self-pay | Admitting: Emergency Medicine

## 2018-04-12 ENCOUNTER — Emergency Department: Payer: Self-pay

## 2018-04-12 ENCOUNTER — Emergency Department
Admission: EM | Admit: 2018-04-12 | Discharge: 2018-04-12 | Disposition: A | Discharge status: Home / Self Care | Payer: Self-pay | Attending: Emergency Medicine | Admitting: Emergency Medicine

## 2018-04-12 ENCOUNTER — Other Ambulatory Visit: Payer: Self-pay

## 2018-04-12 DIAGNOSIS — B9789 Other viral agents as the cause of diseases classified elsewhere: Secondary | ICD-10-CM

## 2018-04-12 DIAGNOSIS — J45909 Unspecified asthma, uncomplicated: Secondary | ICD-10-CM | POA: Insufficient documentation

## 2018-04-12 DIAGNOSIS — J069 Acute upper respiratory infection, unspecified: Secondary | ICD-10-CM | POA: Insufficient documentation

## 2018-04-12 DIAGNOSIS — Z79899 Other long term (current) drug therapy: Secondary | ICD-10-CM | POA: Insufficient documentation

## 2018-04-12 LAB — GROUP A STREP BY PCR: Group A Strep by PCR: NOT DETECTED

## 2018-04-12 MED ORDER — BENZONATATE 100 MG PO CAPS: 100.0000 mg | ORAL_CAPSULE | Freq: Two times a day (BID) | ORAL | 0 refills | Status: AC | PRN

## 2018-04-12 MED ORDER — PREDNISONE 50 MG PO TABS: ORAL_TABLET | ORAL | 0 refills | Status: DC

## 2018-04-12 NOTE — ED Triage Notes (Signed)
Pt presents with fever of 99.8 and cough/congestion x 3 days. Pt states he has been coughing up yellow phlegm. Pt alert & oriented with NAD noted.

## 2018-04-13 NOTE — ED Provider Notes (Signed)
[facility] Emergency Department Provider Note  ____________________________________________  Time seen: Approximately 12:05 AM  I have reviewed the triage vital signs and the nursing notes.   HISTORY  Chief Complaint Cough    HPI Glenn Acosta is a 27 y.o. male presents to the emergency department with low-grade fever, pharyngitis, rhinorrhea and productive cough for the past 3 days.  Patient reports that he typically has 1-2 cases of bronchitis annually.  He denies shortness of breath, chest tightness or chest pain.  He denies diarrhea or vomiting.  He is a daily smoker.  No alleviating measures have been attempted.   Past Medical History:  Diagnosis Date  . Asthma     There are no active problems to display for this patient.   Past Surgical History:  Procedure Laterality Date  . ROTATOR CUFF REPAIR Left     Prior to Admission medications   Medication Sig Start Date End Date Taking? Authorizing Provider  benzonatate (TESSALON PERLES) 100 MG capsule Take 1 capsule (100 mg total) by mouth 2 (two) times daily as needed for up to 7 days for cough. 04/12/18 04/19/18  Orvil Feil, PA-C  fluticasone (FLONASE) 50 MCG/ACT nasal spray Place 2 sprays into both nostrils daily. 02/15/18   Menshew, Charlesetta Ivory, PA-C  predniSONE (DELTASONE) 50 MG tablet Take one 50 mg tablet once daily for the next five days. 04/12/18   Orvil Feil, PA-C    Allergies Iodine and Shellfish allergy  History reviewed. No pertinent family history.  Social History Social History   Tobacco Use  . Smoking status: Never Smoker  . Smokeless tobacco: Never Used  . Tobacco comment: occasional  Substance Use Topics  . Alcohol use: No  . Drug use: No      Review of Systems  Constitutional: Patient has fever.  Eyes: No visual changes. No discharge ENT: Patient has congestion.  Cardiovascular: no chest pain. Respiratory: Patient has cough.  Gastrointestinal: No  abdominal pain.  No nausea, no vomiting. Patient had diarrhea.  Genitourinary: Negative for dysuria. No hematuria Musculoskeletal: Patient has myalgias.  Skin: Negative for rash, abrasions, lacerations, ecchymosis. Neurological: Patient has headache, no focal weakness or numbness.     ____________________________________________   PHYSICAL EXAM:  VITAL SIGNS: ED Triage Vitals [04/12/18 1851]  Enc Vitals Group     BP 124/63     Pulse Rate 72     Resp 18     Temp 98.3 F (36.8 C)     Temp Source Oral     SpO2 100 %     Weight 190 lb (86.2 kg)     Height 6\' 3"  (1.905 m)     Head Circumference      Peak Flow      Pain Score 8     Pain Loc      Pain Edu?      Excl. in [place]?      Constitutional: Alert and oriented. Well appearing and in no acute distress. Eyes: Conjunctivae are normal. PERRL. EOMI. Head: Atraumatic. ENT:      Ears: TMs are occluded by wax bilaterally.      Nose: No congestion/rhinnorhea.      Mouth/Throat: Mucous membranes are moist.  Posterior pharynx is erythematous.  Uvula is midline. Neck: No stridor.  No cervical spine tenderness to palpation. Hematological/Lymphatic/Immunilogical: Palpable cervical lymphadenopathy. Cardiovascular: Normal rate, regular rhythm. Normal S1 and S2.  Good peripheral circulation. Respiratory: Normal respiratory effort without tachypnea or retractions.  Lungs CTAB. Good air entry to the bases with no decreased or absent breath sounds.  Skin:  Skin is warm, dry and intact. No rash noted.  ____________________________________________   LABS (all labs ordered are listed, but only abnormal results are displayed)  Labs Reviewed  GROUP A STREP BY PCR   ____________________________________________  EKG   ____________________________________________  RADIOLOGY I personally viewed and evaluated these images as part of my medical decision making, as well as reviewing the written report by the radiologist.  Dg Chest 2  View  Result Date: 04/12/2018 CLINICAL DATA:  Cough, fever EXAM: CHEST - 2 VIEW COMPARISON:  11/03/2017 FINDINGS: Lungs are essentially clear. No focal consolidation. No pleural effusion or pneumothorax. The heart is normal in size. Visualized osseous structures are within normal limits. IMPRESSION: Normal chest radiographs. Electronically Signed   By: Charline BillsSriyesh  Krishnan M.D.   On: 04/12/2018 19:56    ____________________________________________    PROCEDURES  Procedure(s) performed:    Procedures    Medications - No data to display   ____________________________________________   INITIAL IMPRESSION / ASSESSMENT AND PLAN / ED COURSE  Pertinent labs & imaging results that were available during my care of the patient were reviewed by me and considered in my medical decision making (see chart for details).  Review of the  CSRS was performed in accordance of the [facility] prior to dispensing any controlled drugs.      Assessment and plan Viral URI with cough Patient presents to the emergency department with rhinorrhea, congestion and cough for the past 3 days with low-grade fever.  Chest x-ray reveals no consolidations or findings consistent with pneumonia.  Patient was treated with prednisone and Tessalon Perles he was advised to follow-up with primary care as needed.  All patient questions were answered.     ____________________________________________  FINAL CLINICAL IMPRESSION(S) / ED DIAGNOSES  Final diagnoses:  Viral URI with cough      NEW MEDICATIONS STARTED DURING THIS VISIT:  ED Discharge Orders        Ordered    predniSONE (DELTASONE) 50 MG tablet     04/12/18 2149    benzonatate (TESSALON PERLES) 100 MG capsule  2 times daily PRN     04/12/18 2149          This chart was dictated using voice recognition software/Dragon. Despite best efforts to proofread, errors can occur which can change the meaning. Any change was purely unintentional.    Orvil FeilWoods,  Thi Sisemore M, PA-C 04/13/18 0010    Emily FilbertWilliams, Jonathan E, MD 04/13/18 [phone]

## 2019-09-22 ENCOUNTER — Emergency Department
Admission: EM | Admit: 2019-09-22 | Discharge: 2019-09-22 | Disposition: A | Discharge status: Home / Self Care | Payer: Self-pay | Attending: Emergency Medicine | Admitting: Emergency Medicine

## 2019-09-22 ENCOUNTER — Other Ambulatory Visit: Payer: Self-pay

## 2019-09-22 ENCOUNTER — Emergency Department: Payer: Self-pay

## 2019-09-22 ENCOUNTER — Encounter: Payer: Self-pay | Admitting: Emergency Medicine

## 2019-09-22 DIAGNOSIS — M25511 Pain in right shoulder: Secondary | ICD-10-CM | POA: Insufficient documentation

## 2019-09-22 DIAGNOSIS — J45909 Unspecified asthma, uncomplicated: Secondary | ICD-10-CM | POA: Insufficient documentation

## 2019-09-22 MED ORDER — IBUPROFEN 800 MG PO TABS
800.0000 mg | ORAL_TABLET | Freq: Once | ORAL | Status: AC
  Administered 2019-09-22: 11:00:00 800 mg via ORAL
  Filled 2019-09-22: qty 1

## 2019-09-22 MED ORDER — MELOXICAM 15 MG PO TABS: 15.0000 mg | ORAL_TABLET | Freq: Every day | ORAL | 2 refills | Status: AC

## 2019-09-22 MED ORDER — HYDROCODONE-ACETAMINOPHEN 5-325 MG PO TABS: 1.0000 | ORAL_TABLET | Freq: Four times a day (QID) | ORAL | 0 refills | Status: DC | PRN

## 2019-09-22 NOTE — ED Provider Notes (Signed)
[facility] Emergency Department Provider Note  ____________________________________________   First MD Initiated Contact with Patient 09/22/19 1048     (approximate)  I have reviewed the triage vital signs and the nursing notes.   HISTORY  Chief Complaint Shoulder Pain    HPI Glenn Acosta is a 29 y.o. male complains of right shoulder pain.  States he helped lift a mattress last night and started having pain later in the night.  History of a rotator cuff tear in his left shoulder.  States he had that repaired by Dr. Francia Greaves several years ago.  He states the pain is similar to when he tore the rotator cuff.  He denies any other injuries.  No neck pain.  No numbness or tingling.    Past Medical History:  Diagnosis Date  . Asthma     There are no problems to display for this patient.   Past Surgical History:  Procedure Laterality Date  . ROTATOR CUFF REPAIR Left     Prior to Admission medications   Medication Sig Start Date End Date Taking? Authorizing Provider  HYDROcodone-acetaminophen (NORCO/VICODIN) 5-325 MG tablet Take 1 tablet by mouth every 6 (six) hours as needed for moderate pain. 09/22/19   Takina Busser, Linden Dolin, PA-C  meloxicam (MOBIC) 15 MG tablet Take 1 tablet (15 mg total) by mouth daily. 09/22/19 09/21/20  Versie Starks, PA-C    Allergies Iodine and Shellfish allergy  No family history on file.  Social History Social History   Tobacco Use  . Smoking status: Never Smoker  . Smokeless tobacco: Never Used  . Tobacco comment: occasional  Substance Use Topics  . Alcohol use: No  . Drug use: No    Review of Systems  Constitutional: No fever/chills Eyes: No visual changes. ENT: No sore throat. Respiratory: Denies cough Cardiovascular: Denies chest pain Gastrointestinal: Denies abdominal pain Genitourinary: Negative for dysuria. Musculoskeletal: Negative for back pain.  Positive for right shoulder pain Skin: Negative for  rash. Psychiatric: no mood changes,     ____________________________________________   PHYSICAL EXAM:  VITAL SIGNS: ED Triage Vitals  Enc Vitals Group     BP 09/22/19 1031 (!) 141/88     Pulse Rate 09/22/19 1031 64     Resp 09/22/19 1031 18     Temp 09/22/19 1031 98.3 F (36.8 C)     Temp Source 09/22/19 1031 Oral     SpO2 09/22/19 1031 100 %     Weight --      Height --      Head Circumference --      Peak Flow --      Pain Score 09/22/19 1041 10     Pain Loc --      Pain Edu? --      Excl. in [place]? --     Constitutional: Alert and oriented. Well appearing and in no acute distress. Eyes: Conjunctivae are normal.  Head: Atraumatic. Nose: No congestion/rhinnorhea. Mouth/Throat: Mucous membranes are moist.   Neck:  supple no lymphadenopathy noted Cardiovascular: Normal rate, regular rhythm.  Respiratory: Normal respiratory effort.  No retractions, d GU: deferred Musculoskeletal: Decreased range of motion of the right shoulder, shoulder joint itself is tender to palpation, the area feels soft and stiff questionable dislocation or muscle tear neurologic:  Normal speech and language.  Skin:  Skin is warm, dry and intact. No rash noted. Psychiatric: Mood and affect are normal. Speech and behavior are normal.  ____________________________________________   LABS (all  labs ordered are listed, but only abnormal results are displayed)  Labs Reviewed - No data to display ____________________________________________   ____________________________________________  RADIOLOGY  X-ray of the right shoulder is negative  ____________________________________________   PROCEDURES  Procedure(s) performed: Ibuprofen 800 mg p.o., shoulder immobilizer  Procedures    ____________________________________________   INITIAL IMPRESSION / ASSESSMENT AND PLAN / ED COURSE  Pertinent labs & imaging results that were available during my care of the patient were reviewed by me and  considered in my medical decision making (see chart for details).   Patient is 29 year old male presents emergency department right shoulder pain.  See HPI  Physical exam shows right shoulder to be tender.  Patient has decreased range of motion.  X-ray of the right shoulder   X-ray of the right shoulder is negative. Explained the findings to the patient.  Is placed in a shoulder immobilizer.  Follow-up with orthopedics.  Is given a prescription for meloxicam and Vicodin.  Work note for today.  He is to follow-up with orthopedics return if worsening.  Is discharged in stable condition.  Glenn Acosta was evaluated in Emergency Department on 09/22/2019 for the symptoms described in the history of present illness. He was evaluated in the context of the global COVID-19 pandemic, which necessitated consideration that the patient might be at risk for infection with the SARS-CoV-2 virus that causes COVID-19. Institutional protocols and algorithms that pertain to the evaluation of patients at risk for COVID-19 are in a state of rapid change based on information released by regulatory bodies including the CDC and federal and state organizations. These policies and algorithms were followed during the patient's care in the ED.   As part of my medical decision making, I reviewed the following data within the electronic MEDICAL RECORD NUMBER Nursing notes reviewed and incorporated, Old chart reviewed, Radiograph reviewed , Notes from prior ED visits and [place] Controlled Substance Database  ____________________________________________   FINAL CLINICAL IMPRESSION(S) / ED DIAGNOSES  Final diagnoses:  Acute pain of right shoulder      NEW MEDICATIONS STARTED DURING THIS VISIT:  Discharge Medication List as of 09/22/2019 11:52 AM    START taking these medications   Details  HYDROcodone-acetaminophen (NORCO/VICODIN) 5-325 MG tablet Take 1 tablet by mouth every 6 (six) hours as needed for moderate pain.,  Starting Fri 09/22/2019, Normal    meloxicam (MOBIC) 15 MG tablet Take 1 tablet (15 mg total) by mouth daily., Starting Fri 09/22/2019, Until Sat 09/21/2020, Normal         Note:  This document was prepared using Dragon voice recognition software and may include unintentional dictation errors.    Faythe Ghee, PA-C 09/22/19 1249    Shaune Pollack, MD 09/23/19 [phone]

## 2019-09-22 NOTE — ED Triage Notes (Signed)
Pt states right shoulder pain since last night, was helping his mom with a mattress, didn't injure it that he knows of, dislocated his right shoulder 5 years ago, doesn't feel like it's dislocated. States he had a torn rotator cuff in left shoulder a while back and states this feels the same. NAD.

## 2019-09-22 NOTE — ED Notes (Signed)
See triage note  Developed right shoulder pain after helping lift a mattress last pm  Denies any fall  No deformity noted  Good pulses

## 2019-09-22 NOTE — Discharge Instructions (Signed)
Follow-up with your regular doctor if not better in 3 to 5 days.  Or follow-up with prenatal clinic orthopedics.  Take medication as prescribed.

## 2020-11-04 ENCOUNTER — Encounter: Payer: Self-pay | Admitting: Emergency Medicine

## 2020-11-04 ENCOUNTER — Emergency Department
Admission: EM | Admit: 2020-11-04 | Discharge: 2020-11-04 | Disposition: A | Discharge status: Home / Self Care | Payer: Self-pay | Attending: Emergency Medicine | Admitting: Emergency Medicine

## 2020-11-04 ENCOUNTER — Other Ambulatory Visit: Payer: Self-pay

## 2020-11-04 DIAGNOSIS — J45909 Unspecified asthma, uncomplicated: Secondary | ICD-10-CM | POA: Insufficient documentation

## 2020-11-04 DIAGNOSIS — K0889 Other specified disorders of teeth and supporting structures: Secondary | ICD-10-CM | POA: Insufficient documentation

## 2020-11-04 MED ORDER — AMOXICILLIN 875 MG PO TABS: 875.0000 mg | ORAL_TABLET | Freq: Two times a day (BID) | ORAL | 0 refills | Status: AC

## 2020-11-04 MED ORDER — KETOROLAC TROMETHAMINE 10 MG PO TABS: 10.0000 mg | ORAL_TABLET | Freq: Four times a day (QID) | ORAL | 0 refills | Status: AC | PRN

## 2020-11-04 MED ORDER — KETOROLAC TROMETHAMINE 30 MG/ML IJ SOLN
30.0000 mg | Freq: Once | INTRAMUSCULAR | Status: AC
  Administered 2020-11-04: 30 mg via INTRAMUSCULAR
  Filled 2020-11-04: qty 1

## 2020-11-04 NOTE — ED Triage Notes (Signed)
C/O right upper jaw dental pain x 2 days.

## 2020-11-04 NOTE — ED Notes (Signed)
See triage note  Presents with possible dental abscess  Right jaw pain for 2 days

## 2020-11-04 NOTE — Discharge Instructions (Addendum)
OPTIONS FOR DENTAL FOLLOW UP CARE ° °[place] Department of Health and Human Services - Local Safety Net Dental Clinics °http://www.ncdhhs.gov/dph/oralhealth/services/safetynetclinics.htm °  °[facility] (336-562-3123) ° °[facility] [place] ([phone]) ° °[facility] ([phone] ext [phone]) ° ° [facility] ([phone]) ° °[facility] ([phone]) °This clinic caters to the indigent population and is on a lottery system. °Location: °[facility], [address], [place] °Clinic Hours: °Wednesdays from 6pm - 9pm, patients seen by a lottery system. °For dates, call or go to [email or web address]/[facility]/[email or web address] °Services: °Cleanings, fillings and simple extractions. °Payment Options: °DENTAL WORK IS FREE OF CHARGE. Bring proof of income or support. °Best way to get seen: °Arrive at 5:15 pm - this is a lottery, NOT first come/first serve, so arriving earlier will not increase your chances of being seen. °  °  °[facility] °[phone] °Select option 1 for emergencies °  °Location: °[facility], [address], [place] °Clinic Hours: °No walk-ins accepted - call the day before to schedule an appointment. °Check in times are 9:30 am and 1:30 pm. °Services: °Simple extractions, temporary fillings, pulpectomy/pulp debridement, uncomplicated abscess drainage. °Payment Options: °PAYMENT IS DUE AT THE TIME OF SERVICE.  Fee is usually $100-200, additional surgical procedures (e.g. abscess drainage) may be extra. °Cash, checks, Visa/MasterCard accepted.  Can file Medicaid if patient is covered for dental - patient should call case worker to check. °No discount for [facility] patients. °Best way to get seen: °MUST call the day before and get onto the schedule. Can usually be seen the next 1-2 days. No walk-ins accepted. °  °  °[facility] °[phone] °   °Location: °[facility], [address], [place] °Clinic Hours: °M, W, Th, F 8am or 1:30pm, Tues 9a or 1:30 - first come/first served. °Services: °Simple extractions, temporary fillings, uncomplicated abscess drainage.  You do not need to be an [place] resident. °Payment Options: °PAYMENT IS DUE AT THE TIME OF SERVICE. °Dental insurance, otherwise sliding scale - bring proof of income or support. °Depending on income and treatment needed, cost is usually $50-200. °Best way to get seen: °Arrive early as it is first come/first served. °  °  °[facility] °[phone] °  °Location: °[address] °Clinic Hours: °Mon-Thu 8a-5p °Services: °Most basic dental services including extractions and fillings. °Payment Options: °PAYMENT IS DUE AT THE TIME OF SERVICE. °Sliding scale, up to 50% off - bring proof if income or support. °Medicaid with dental option accepted. °Best way to get seen: °Call to schedule an appointment, can usually be seen within 2 weeks OR they will try to see walk-ins - show up at 8a or 2p (you may have to wait). °  °  °[facility] °[phone] °[place] RESIDENTS ONLY °  °Location: °[facility], [address], [place], [place] [address] °Clinic Hours: By appointment only. °Monday - Thursday 8am-5pm, Friday 8am-12pm °Services: Cleanings, fillings, extractions. °Payment Options: °PAYMENT IS DUE AT THE TIME OF SERVICE. °Cash, Visa or MasterCard. Sliding scale - $30 minimum per service. °Best way to get seen: °Come in to office, complete packet and make an appointment - need proof of income °or support monies for each household member and proof of [place] residence. °Usually takes about a month to get in. °  °  °[facility] °[phone] °  °Location: °[address]   [place] °Clinic Hours: Walk-in Urgent Care Dental Services are offered Monday-Friday  mornings only. °The numbers of emergencies accepted daily is limited to the number of °providers available. °Maximum 15 - Mondays, Wednesdays & Thursdays °Maximum 10 - Tuesdays & Fridays °Services: °You do not need to be a  [place] resident to be seen for a dental emergency. °Emergencies are defined as pain, swelling, abnormal bleeding, or dental trauma. Walkins will receive x-rays if needed. °NOTE: Dental cleaning is not an emergency. °Payment Options: °PAYMENT IS DUE AT THE TIME OF SERVICE. °Minimum co-pay is $40.00 for uninsured patients. °Minimum co-pay is $3.00 for Medicaid with dental coverage. °Dental Insurance is accepted and must be presented at time of visit. °Medicare does not cover dental. °Forms of payment: Cash, credit card, checks. °Best way to get seen: °If not previously registered with the clinic, walk-in dental registration begins at 7:15 am and is on a first come/first serve basis. °If previously registered with the clinic, call to make an appointment. °  °  °[facility] °[phone] °[place] RESIDENTS ONLY °  °Location: °[address], [place], [place] °Clinic Hours: °Mon-Thu 10a-2p °Services: Extractions only! °Payment Options: °FREE (donations accepted) - bring proof of income or support °Best way to get seen: °Call and schedule an appointment OR come at 8am on the 1st Monday of every month (except for holidays) when it is first come/first served. °  °  °[facility] °[phone] °  °Location: °[address], [place] °Clinic Hours: °Friday mornings °Services, Payment Options, Best way to get seen: °Call for info °

## 2020-11-04 NOTE — ED Notes (Signed)
Provided DC instructions. Verbalized understanding. Alert, oriented, and ambulatory upon discharge with NAD.

## 2020-11-04 NOTE — ED Provider Notes (Signed)
[facility]-EMERGENCY DEPARTMENT  ____________________________________________  Time seen: Approximately 7:04 PM  I have reviewed the triage vital signs and the nursing notes.   HISTORY  Chief Complaint Dental Pain   Historian Patient     HPI Glenn Acosta is a 30 y.o. male presents to the emergency department with superior 1 and 2 pain.  Patient states that he has had pain for the past 2 days.  He states that he has been meaning to make an appointment with a dentist but has not yet.  He states that he had to miss work due to his discomfort.  He denies fever and chills.  He states that he has pain with chewing.  He does have some mild trismus.  No significant swelling of the upper or lower jaw.  No pain underneath the tongue or difficulty swallowing.   Past Medical History:  Diagnosis Date  . Asthma      Immunizations up to date:  Yes.     Past Medical History:  Diagnosis Date  . Asthma     There are no problems to display for this patient.   Past Surgical History:  Procedure Laterality Date  . ROTATOR CUFF REPAIR Left     Prior to Admission medications   Medication Sig Start Date End Date Taking? Authorizing Provider  amoxicillin (AMOXIL) 875 MG tablet Take 1 tablet (875 mg total) by mouth 2 (two) times daily for 10 days. 11/04/20 11/14/20 Yes Pia Mau M, PA-C  ketorolac (TORADOL) 10 MG tablet Take 1 tablet (10 mg total) by mouth every 6 (six) hours as needed for up to 5 days. 11/04/20 11/09/20 Yes Pia Mau M, PA-C    Allergies Iodine and Shellfish allergy  No family history on file.  Social History Social History   Tobacco Use  . Smoking status: Never Smoker  . Smokeless tobacco: Never Used  . Tobacco comment: occasional  Vaping Use  . Vaping Use: Never used  Substance Use Topics  . Alcohol use: No  . Drug use: No     Review of Systems  Constitutional: No fever/chills Eyes:  No discharge ENT: Patient has dental pain.  Respiratory: no  cough. No SOB/ use of accessory muscles to breath Gastrointestinal:   No nausea, no vomiting.  No diarrhea.  No constipation. Musculoskeletal: Negative for musculoskeletal pain. Skin: Negative for rash, abrasions, lacerations, ecchymosis.   ____________________________________________   PHYSICAL EXAM:  VITAL SIGNS: ED Triage Vitals  Enc Vitals Group     BP 11/04/20 1803 (!) 156/106     Pulse Rate 11/04/20 1803 68     Resp 11/04/20 1803 16     Temp 11/04/20 1803 99.6 F (37.6 C)     Temp Source 11/04/20 1803 Oral     SpO2 11/04/20 1803 100 %     Weight 11/04/20 1802 190 lb 0.6 oz (86.2 kg)     Height 11/04/20 1802 6\' 3"  (1.905 m)     Head Circumference --      Peak Flow --      Pain Score 11/04/20 1801 10     Pain Loc --      Pain Edu? --      Excl. in GC? --      Constitutional: Alert and oriented. Well appearing and in no acute distress. Eyes: Conjunctivae are normal. PERRL. EOMI. Head: Atraumatic. ENT:      Ears:       Nose: No congestion/rhinnorhea.      Mouth/Throat: Mucous membranes  are moist.  Patient has broke Superior 2.  Neck: No stridor.  No cervical spine tenderness to palpation. Cardiovascular: Normal rate, regular rhythm. Normal S1 and S2.  Good peripheral circulation. Respiratory: Normal respiratory effort without tachypnea or retractions. Lungs CTAB. Good air entry to the bases with no decreased or absent breath sounds Gastrointestinal: Bowel sounds x 4 quadrants. Soft and nontender to palpation. No guarding or rigidity. No distention. Musculoskeletal: Full range of motion to all extremities. No obvious deformities noted Neurologic:  Normal for age. No gross focal neurologic deficits are appreciated.  Skin:  Skin is warm, dry and intact. No rash noted. Psychiatric: Mood and affect are normal for age. Speech and behavior are normal.   ____________________________________________   LABS (all labs ordered are listed, but only abnormal results are  displayed)  Labs Reviewed - No data to display ____________________________________________  EKG   ____________________________________________  RADIOLOGY   No results found.  ____________________________________________    PROCEDURES  Procedure(s) performed:     Procedures     Medications  ketorolac (TORADOL) 30 MG/ML injection 30 mg (has no administration in time range)     ____________________________________________   INITIAL IMPRESSION / ASSESSMENT AND PLAN / ED COURSE  Pertinent labs & imaging results that were available during my care of the patient were reviewed by me and considered in my medical decision making (see chart for details).       Assessment and plan Dental pain 30 year old male presents to the emergency department with a broken superior to dental pain for the past 2 days.  He was hypertensive at triage but vital signs were otherwise reassuring.  He was started on amoxicillin and given injection of Toradol.  He was discharged with Toradol.  Return precautions were given to return with new or worsening symptoms.  All patient questions were answered.    ____________________________________________  FINAL CLINICAL IMPRESSION(S) / ED DIAGNOSES  Final diagnoses:  Pain, dental      NEW MEDICATIONS STARTED DURING THIS VISIT:  ED Discharge Orders         Ordered    ketorolac (TORADOL) 10 MG tablet  Every 6 hours PRN        11/04/20 1858    amoxicillin (AMOXIL) 875 MG tablet  2 times daily        11/04/20 1858              This chart was dictated using voice recognition software/Dragon. Despite best efforts to proofread, errors can occur which can change the meaning. Any change was purely unintentional.     Orvil Feil, PA-C 11/04/20 1907    Gilles Chiquito, MD 11/05/20 [phone]

## 2021-01-20 ENCOUNTER — Encounter: Payer: Self-pay | Admitting: *Deleted

## 2021-01-20 ENCOUNTER — Other Ambulatory Visit: Payer: Self-pay

## 2021-01-20 ENCOUNTER — Emergency Department
Admission: EM | Admit: 2021-01-20 | Discharge: 2021-01-20 | Disposition: A | Discharge status: Home / Self Care | Payer: Self-pay | Attending: Emergency Medicine | Admitting: Emergency Medicine

## 2021-01-20 DIAGNOSIS — J45909 Unspecified asthma, uncomplicated: Secondary | ICD-10-CM | POA: Insufficient documentation

## 2021-01-20 DIAGNOSIS — R0981 Nasal congestion: Secondary | ICD-10-CM

## 2021-01-20 DIAGNOSIS — J069 Acute upper respiratory infection, unspecified: Secondary | ICD-10-CM | POA: Insufficient documentation

## 2021-01-20 DIAGNOSIS — Z7951 Long term (current) use of inhaled steroids: Secondary | ICD-10-CM | POA: Insufficient documentation

## 2021-01-20 MED ORDER — FLUTICASONE PROPIONATE 50 MCG/ACT NA SUSP: 2.0000 | Freq: Every day | NASAL | 0 refills | Status: DC

## 2021-01-20 NOTE — ED Provider Notes (Signed)
[facility] Emergency Department Provider Note  ____________________________________________   Event Date/Time   First MD Initiated Contact with Patient 01/20/21 2109     (approximate)  I have reviewed the triage vital signs and the nursing notes.   HISTORY  Chief Complaint Nasal Congestion  HPI Glenn Acosta is a 30 y.o. male who presents to the emergency department for evaluation of sinus congestion for 4 days.  Patient states that he took Mucinex the first 2 days, but then "did not want my body to become used to it" and stopped the medication.  He states that initially it was yellow/green discharge but has since been improving and has been clearish.  He states that he usually gets these once a year and is requesting antibiotics to clear his sinus infection.  He denies any fevers, facial pain, sore throat, chest pain, shortness of breath, abdominal pain or any other associated symptoms.        Past Medical History:  Diagnosis Date  . Asthma     There are no problems to display for this patient.   Past Surgical History:  Procedure Laterality Date  . ROTATOR CUFF REPAIR Left     Prior to Admission medications   Medication Sig Start Date End Date Taking? Authorizing Provider  fluticasone (FLONASE) 50 MCG/ACT nasal spray Place 2 sprays into both nostrils daily. 01/20/21 02/19/21 Yes Lucy Chris, PA    Allergies Iodine and Shellfish allergy  No family history on file.  Social History Social History   Tobacco Use  . Smoking status: Never Smoker  . Smokeless tobacco: Never Used  . Tobacco comment: occasional  Vaping Use  . Vaping Use: Never used  Substance Use Topics  . Alcohol use: No  . Drug use: No    Review of Systems Constitutional: No fever/chills Eyes: No visual changes. ENT: + nasal congestion, No sore throat. Cardiovascular: Denies chest pain. Respiratory: Denies shortness of breath. Gastrointestinal: No abdominal  pain.  No nausea, no vomiting.  No diarrhea.  No constipation. Genitourinary: Negative for dysuria. Musculoskeletal: Negative for back pain. Skin: Negative for rash. Neurological: Negative for headaches, focal weakness or numbness.  ____________________________________________   PHYSICAL EXAM:  VITAL SIGNS: ED Triage Vitals [01/20/21 2011]  Enc Vitals Group     BP (!) 141/80     Pulse Rate 73     Resp 18     Temp 98.3 F (36.8 C)     Temp Source Oral     SpO2 100 %     Weight 190 lb (86.2 kg)     Height 6\' 3"  (1.905 m)     Head Circumference      Peak Flow      Pain Score 0     Pain Loc      Pain Edu?      Excl. in GC?    Constitutional: Alert and oriented. Well appearing and in no acute distress. Eyes: Conjunctivae are normal. PERRL. EOMI. Head: Atraumatic. Nose: Mild congestion/rhinnorhea.  No tenderness to percussion of the maxillary or frontal sinuses. Mouth/Throat: Mucous membranes are moist.  Oropharynx non-erythematous. Neck: No stridor.   Cardiovascular: Normal rate, regular rhythm. Grossly normal heart sounds.  Good peripheral circulation. Respiratory: Normal respiratory effort.  No retractions. Lungs CTAB. Musculoskeletal: No lower extremity tenderness nor edema.  No joint effusions. Neurologic:  Normal speech and language. No gross focal neurologic deficits are appreciated. No gait instability. Skin:  Skin is warm, dry and intact.  No rash noted. Psychiatric: Mood and affect are normal. Speech and behavior are normal.   ____________________________________________   INITIAL IMPRESSION / ASSESSMENT AND PLAN / ED COURSE  As part of my medical decision making, I reviewed the following data within the electronic MEDICAL RECORD NUMBER Nursing notes reviewed and incorporated and Notes from prior ED visits        Patient is a 30 year old male presents for 4 days of nasal congestion without any other associated symptoms.  See HPI for further details.  In triage,  patient is mildly hypertensive but otherwise has normal vital signs.  Physical exam is very reassuring with only mild nasal congestion noted with no tenderness to the sinuses.  He denies fevers at home.  Discussed with the patient this is likely viral URI and does not warrant antibiotics at this time.  He was encouraged to use Flonase for his nasal congestion as well as continue the over-the-counter Mucinex that he had started initially.  Patient was encouraged to seek repeat medical evaluation if he developed any fever, worsening of symptoms.  Patient is amenable with plan is stable this time for outpatient therapy.      ____________________________________________   FINAL CLINICAL IMPRESSION(S) / ED DIAGNOSES  Final diagnoses:  Nasal congestion  Viral upper respiratory tract infection     ED Discharge Orders         Ordered    fluticasone (FLONASE) 50 MCG/ACT nasal spray  Daily        01/20/21 2119          *Please note:  Glenn Acosta was evaluated in Emergency Department on 01/20/2021 for the symptoms described in the history of present illness. He was evaluated in the context of the global COVID-19 pandemic, which necessitated consideration that the patient might be at risk for infection with the SARS-CoV-2 virus that causes COVID-19. Institutional protocols and algorithms that pertain to the evaluation of patients at risk for COVID-19 are in a state of rapid change based on information released by regulatory bodies including the CDC and federal and state organizations. These policies and algorithms were followed during the patient's care in the ED.  Some ED evaluations and interventions may be delayed as a result of limited staffing during and the pandemic.*   Note:  This document was prepared using Dragon voice recognition software and may include unintentional dictation errors.   Lucy Chris, PA 01/20/21 2139    Sharman Cheek, MD 01/21/21 [phone]

## 2021-01-20 NOTE — Discharge Instructions (Signed)
Please continue the Mucinex as needed for your symptoms.  You may begin using Flonase, 2 sprays per nostril for the first week then 1 spray per nostril after that.  Please return to the emergency department if you experience fever, worsening facial pain or other worsening symptoms.  Otherwise, follow-up with primary care.

## 2021-01-20 NOTE — ED Triage Notes (Signed)
Pt has sinus congestion for 4 days.  Taking otc meds without relief.  Pt alert.

## 2021-02-19 ENCOUNTER — Emergency Department
Admission: EM | Admit: 2021-02-19 | Discharge: 2021-02-19 | Disposition: A | Discharge status: Home / Self Care | Payer: Self-pay | Attending: Emergency Medicine | Admitting: Emergency Medicine

## 2021-02-19 ENCOUNTER — Other Ambulatory Visit: Payer: Self-pay

## 2021-02-19 DIAGNOSIS — Z20822 Contact with and (suspected) exposure to covid-19: Secondary | ICD-10-CM | POA: Insufficient documentation

## 2021-02-19 DIAGNOSIS — J45909 Unspecified asthma, uncomplicated: Secondary | ICD-10-CM | POA: Insufficient documentation

## 2021-02-19 DIAGNOSIS — Z7951 Long term (current) use of inhaled steroids: Secondary | ICD-10-CM | POA: Insufficient documentation

## 2021-02-19 DIAGNOSIS — J0101 Acute recurrent maxillary sinusitis: Secondary | ICD-10-CM | POA: Insufficient documentation

## 2021-02-19 DIAGNOSIS — J029 Acute pharyngitis, unspecified: Secondary | ICD-10-CM | POA: Insufficient documentation

## 2021-02-19 LAB — GROUP A STREP BY PCR: Group A Strep by PCR: NOT DETECTED

## 2021-02-19 MED ORDER — MAGIC MOUTHWASH W/LIDOCAINE: 5.0000 mL | Freq: Four times a day (QID) | ORAL | 0 refills | Status: DC

## 2021-02-19 MED ORDER — FLUTICASONE PROPIONATE 50 MCG/ACT NA SUSP: 1.0000 | Freq: Two times a day (BID) | NASAL | 0 refills | Status: DC

## 2021-02-19 MED ORDER — AMOXICILLIN-POT CLAVULANATE 875-125 MG PO TABS
1.0000 | ORAL_TABLET | Freq: Once | ORAL | Status: AC
  Administered 2021-02-19: 1 via ORAL
  Filled 2021-02-19: qty 1

## 2021-02-19 MED ORDER — AMOXICILLIN-POT CLAVULANATE 875-125 MG PO TABS: 1.0000 | ORAL_TABLET | Freq: Two times a day (BID) | ORAL | 0 refills | Status: DC

## 2021-02-19 MED ORDER — PSEUDOEPH-BROMPHEN-DM 30-2-10 MG/5ML PO SYRP: 10.0000 mL | ORAL_SOLUTION | Freq: Four times a day (QID) | ORAL | 0 refills | Status: DC | PRN

## 2021-02-19 NOTE — ED Provider Notes (Signed)
[facility] Emergency Department Provider Note  ____________________________________________  Time seen: Approximately 8:57 PM  I have reviewed the triage vital signs and the nursing notes.   HISTORY  Chief Complaint Sore Throat    HPI Glenn Acosta is a 30 y.o. male who presents the emergency department complaining of a nasal congestion, sinus pressure, sore throat and cough.  Symptoms have been ongoing x4 days.  Patient states that he has a history of recurrent sinus infections and this feels similar but also slightly different than normal.  Patient has had significant postnasal drainage and he thinks that this is likely causing a sore throat and cough but he typically does not experience this amount of symptoms with a normal sinus infection.  No fevers or chills.  No difficulty breathing or swallowing.  Patient is tried over-the-counter medications with no relief of symptoms prior to arrival.         Past Medical History:  Diagnosis Date  . Asthma     There are no problems to display for this patient.   Past Surgical History:  Procedure Laterality Date  . ROTATOR CUFF REPAIR Left     Prior to Admission medications   Medication Sig Start Date End Date Taking? Authorizing Provider  amoxicillin-clavulanate (AUGMENTIN) 875-125 MG tablet Take 1 tablet by mouth 2 (two) times daily. 02/19/21  Yes Merrin Mcvicker, Delorise Royals, PA-C  brompheniramine-pseudoephedrine-DM 30-2-10 MG/5ML syrup Take 10 mLs by mouth 4 (four) times daily as needed. 02/19/21  Yes Lutie Pickler, Delorise Royals, PA-C  fluticasone (FLONASE) 50 MCG/ACT nasal spray Place 1 spray into both nostrils 2 (two) times daily. 02/19/21  Yes Jaquell Seddon, Delorise Royals, PA-C  magic mouthwash w/lidocaine SOLN Take 5 mLs by mouth 4 (four) times daily. 02/19/21  Yes Ahonesty Woodfin, Delorise Royals, PA-C    Allergies Iodine and Shellfish allergy  History reviewed. No pertinent family history.  Social History Social History    Tobacco Use  . Smoking status: Never Smoker  . Smokeless tobacco: Never Used  . Tobacco comment: occasional  Vaping Use  . Vaping Use: Never used  Substance Use Topics  . Alcohol use: No  . Drug use: No     Review of Systems  Constitutional: No fever/chills Eyes: No visual changes. No discharge ENT: Nasal congestion, sinus pressure, sore throat Cardiovascular: no chest pain. Respiratory: Positive cough. No SOB. Gastrointestinal: No abdominal pain.  No nausea, no vomiting.  No diarrhea.  No constipation. Musculoskeletal: Negative for musculoskeletal pain. Skin: Negative for rash, abrasions, lacerations, ecchymosis. Neurological: Negative for headaches, focal weakness or numbness.  10 System ROS otherwise negative.  ____________________________________________   PHYSICAL EXAM:  VITAL SIGNS: ED Triage Vitals  Enc Vitals Group     BP 02/19/21 1829 (!) 154/93     Pulse Rate 02/19/21 1829 79     Resp 02/19/21 1829 16     Temp 02/19/21 1829 98.4 F (36.9 C)     Temp Source 02/19/21 1829 Oral     SpO2 02/19/21 1829 100 %     Weight 02/19/21 1830 190 lb (86.2 kg)     Height 02/19/21 1830 6\' 3"  (1.905 m)     Head Circumference --      Peak Flow --      Pain Score 02/19/21 1829 0     Pain Loc --      Pain Edu? --      Excl. in GC? --      Constitutional: Alert and oriented. Well appearing and  in no acute distress. Eyes: Conjunctivae are normal. PERRL. EOMI. Head: Atraumatic. ENT:      Ears:       Nose: Moderate congestion/rhinnorhea.  Maxillary sinuses are slightly tender to percussion bilaterally      Mouth/Throat: Mucous membranes are moist.  Oropharynx is nonerythematous and nonedematous.  Uvula is midline Neck: No stridor.  Neck is supple full range of motion Hematological/Lymphatic/Immunilogical: Scattered, nontender anterior cervical lymphadenopathy. Cardiovascular: Normal rate, regular rhythm. Normal S1 and S2.  Good peripheral circulation. Respiratory:  Normal respiratory effort without tachypnea or retractions. Lungs CTAB. Good air entry to the bases with no decreased or absent breath sounds. Musculoskeletal: Full range of motion to all extremities. No gross deformities appreciated. Neurologic:  Normal speech and language. No gross focal neurologic deficits are appreciated.  Skin:  Skin is warm, dry and intact. No rash noted. Psychiatric: Mood and affect are normal. Speech and behavior are normal. Patient exhibits appropriate insight and judgement.   ____________________________________________   LABS (all labs ordered are listed, but only abnormal results are displayed)  Labs Reviewed  GROUP A STREP BY PCR  SARS CORONAVIRUS 2 (TAT 6-24 HRS)   ____________________________________________  EKG   ____________________________________________  RADIOLOGY   No results found.  ____________________________________________    PROCEDURES  Procedure(s) performed:    Procedures    Medications  amoxicillin-clavulanate (AUGMENTIN) 875-125 MG per tablet 1 tablet (1 tablet Oral Given 02/19/21 2256)     ____________________________________________   INITIAL IMPRESSION / ASSESSMENT AND PLAN / ED COURSE  Pertinent labs & imaging results that were available during my care of the patient were reviewed by me and considered in my medical decision making (see chart for details).  Review of the [place] CSRS was performed in accordance of the [facility] prior to dispensing any controlled drugs.           Patient's diagnosis is consistent with sinusitis.  Patient presented to emergency department nasal congestion, postnasal drip, sore throat, cough and sinus pressure.  Patient was tender to percussion over the sinuses.  While this may have definitely started as a viral illness, I do think that patient is trending towards bacterial sinusitis.  Patient was symptom control medications, antibiotic.  No indication for imaging.  Patient has a  negative strep test and a pending COVID test.  He will follow this result in MyChart.  Return precautions discussed with the patient.  Otherwise follow-up primary care as needed..  Patient is given ED precautions to return to the ED for any worsening or new symptoms.     ____________________________________________  FINAL CLINICAL IMPRESSION(S) / ED DIAGNOSES  Final diagnoses:  Acute recurrent maxillary sinusitis      NEW MEDICATIONS STARTED DURING THIS VISIT:  ED Discharge Orders         Ordered    amoxicillin-clavulanate (AUGMENTIN) 875-125 MG tablet  2 times daily        02/19/21 2251    fluticasone (FLONASE) 50 MCG/ACT nasal spray  2 times daily        02/19/21 2251    brompheniramine-pseudoephedrine-DM 30-2-10 MG/5ML syrup  4 times daily PRN        02/19/21 2251    magic mouthwash w/lidocaine SOLN  4 times daily       Note to Pharmacy: Dispense in a 1/1/1 ratio. Use lidocaine, diphenhydramine, prednisolone   02/19/21 2251              This chart was dictated using voice recognition software/Dragon. Despite best  efforts to proofread, errors can occur which can change the meaning. Any change was purely unintentional.    Racheal Patches, PA-C 02/20/21 Oliver Hum, MD 02/20/21 1623

## 2021-02-19 NOTE — ED Triage Notes (Signed)
Pt c/o sore throat since Sunday. States usually gets a sinus infection. Also has seasonal allergies. Multiple negative COVID tests at home. Nasal congestion noted. Denies fever.

## 2021-02-20 LAB — SARS CORONAVIRUS 2 (TAT 6-24 HRS): SARS Coronavirus 2: NEGATIVE

## 2021-07-23 ENCOUNTER — Other Ambulatory Visit: Payer: Self-pay

## 2021-07-23 ENCOUNTER — Encounter: Payer: Self-pay | Admitting: Emergency Medicine

## 2021-07-23 ENCOUNTER — Emergency Department
Admission: EM | Admit: 2021-07-23 | Discharge: 2021-07-23 | Disposition: A | Discharge status: Home / Self Care | Payer: Self-pay | Attending: Emergency Medicine | Admitting: Emergency Medicine

## 2021-07-23 DIAGNOSIS — R052 Subacute cough: Secondary | ICD-10-CM

## 2021-07-23 DIAGNOSIS — J45909 Unspecified asthma, uncomplicated: Secondary | ICD-10-CM | POA: Insufficient documentation

## 2021-07-23 DIAGNOSIS — I10 Essential (primary) hypertension: Secondary | ICD-10-CM | POA: Insufficient documentation

## 2021-07-23 DIAGNOSIS — Z79899 Other long term (current) drug therapy: Secondary | ICD-10-CM | POA: Insufficient documentation

## 2021-07-23 DIAGNOSIS — Z20822 Contact with and (suspected) exposure to covid-19: Secondary | ICD-10-CM | POA: Insufficient documentation

## 2021-07-23 DIAGNOSIS — J029 Acute pharyngitis, unspecified: Secondary | ICD-10-CM | POA: Insufficient documentation

## 2021-07-23 LAB — RESP PANEL BY RT-PCR (FLU A&B, COVID) ARPGX2
Influenza A by PCR: NEGATIVE
Influenza B by PCR: NEGATIVE
SARS Coronavirus 2 by RT PCR: NEGATIVE

## 2021-07-23 NOTE — ED Provider Notes (Addendum)
[facility] Emergency Department Provider Note  ____________________________________________   Event Date/Time   First MD Initiated Contact with Patient 07/23/21 1712     (approximate)  I have reviewed the triage vital signs and the nursing notes.   HISTORY  Chief Complaint Cough   HPI Glenn Acosta is a 30 y.o. male with a past medical history of asthma who presents for assessment of mild cough requesting clearance to go back to work.  Patient states he initially got sick last month on the 18th and for several days had posttussive chest pain, productive green phlegm, headaches, fevers, myalgias, sore throat and congestion and feels vastly better stating most of his symptoms resolved after about a week but he still has a very mild residual nonproductive cough.  He has not had any fevers, chest pain, sore throat, joint congestion, headache, earache, sore throat or any other sick symptoms after his initial episode improved.  He denies any abdominal pain, vomiting, diarrhea, rash or injuries or falls.  Notes that prior to getting sick he used to smoke a cigar every other week but has not smoked at all and denies any other drug use or significant medical history.  No other acute concerns at this time.         Past Medical History:  Diagnosis Date   Asthma     There are no problems to display for this patient.   Past Surgical History:  Procedure Laterality Date   ROTATOR CUFF REPAIR Left     Prior to Admission medications   Medication Sig Start Date End Date Taking? Authorizing Provider  amoxicillin-clavulanate (AUGMENTIN) 875-125 MG tablet Take 1 tablet by mouth 2 (two) times daily. 02/19/21   Cuthriell, Delorise Royals, PA-C  brompheniramine-pseudoephedrine-DM 30-2-10 MG/5ML syrup Take 10 mLs by mouth 4 (four) times daily as needed. 02/19/21   Cuthriell, Delorise Royals, PA-C  fluticasone (FLONASE) 50 MCG/ACT nasal spray Place 1 spray into both nostrils 2 (two)  times daily. 02/19/21   Cuthriell, Delorise Royals, PA-C  magic mouthwash w/lidocaine SOLN Take 5 mLs by mouth 4 (four) times daily. 02/19/21   Cuthriell, Delorise Royals, PA-C    Allergies Iodine and Shellfish allergy  History reviewed. No pertinent family history.  Social History Social History   Tobacco Use   Smoking status: Never   Smokeless tobacco: Never   Tobacco comments:    occasional  Vaping Use   Vaping Use: Never used  Substance Use Topics   Alcohol use: No   Drug use: No    Review of Systems  Review of Systems  Constitutional:  Negative for chills and fever.  HENT:  Negative for sore throat.   Eyes:  Negative for pain.  Respiratory:  Positive for cough. Negative for stridor.   Cardiovascular:  Negative for chest pain.  Gastrointestinal:  Negative for vomiting.  Genitourinary:  Negative for dysuria.  Musculoskeletal:  Negative for myalgias.  Skin:  Negative for rash.  Neurological:  Negative for seizures, loss of consciousness and headaches.  Psychiatric/Behavioral:  Negative for suicidal ideas.   All other systems reviewed and are negative.    ____________________________________________   PHYSICAL EXAM:  VITAL SIGNS: ED Triage Vitals  Enc Vitals Group     BP 07/23/21 1652 (!) 153/74     Pulse Rate 07/23/21 1652 85     Resp 07/23/21 1652 18     Temp 07/23/21 1652 98.5 F (36.9 C)     Temp src --  SpO2 07/23/21 1652 100 %     Weight 07/23/21 1642 190 lb 0.6 oz (86.2 kg)     Height 07/23/21 1642 6\' 3"  (1.905 m)     Head Circumference --      Peak Flow --      Pain Score 07/23/21 1642 0     Pain Loc --      Pain Edu? --      Excl. in GC? --    Vitals:   07/23/21 1652  BP: (!) 153/74  Pulse: 85  Resp: 18  Temp: 98.5 F (36.9 C)  SpO2: 100%   Physical Exam Vitals and nursing note reviewed.  Constitutional:      Appearance: He is well-developed.  HENT:     Head: Normocephalic and atraumatic.     Right Ear: External ear normal.     Left  Ear: External ear normal.     Nose: Nose normal.  Eyes:     Conjunctiva/sclera: Conjunctivae normal.  Cardiovascular:     Rate and Rhythm: Normal rate and regular rhythm.     Heart sounds: No murmur heard. Pulmonary:     Effort: Pulmonary effort is normal. No respiratory distress.     Breath sounds: Normal breath sounds.  Abdominal:     Palpations: Abdomen is soft.     Tenderness: There is no abdominal tenderness.  Musculoskeletal:     Cervical back: Neck supple.  Skin:    General: Skin is warm and dry.     Capillary Refill: Capillary refill takes less than 2 seconds.  Neurological:     Mental Status: He is alert and oriented to person, place, and time.  Psychiatric:        Mood and Affect: Mood normal.     ____________________________________________   LABS (all labs ordered are listed, but only abnormal results are displayed)  Labs Reviewed  RESP PANEL BY RT-PCR (FLU A&B, COVID) ARPGX2   ____________________________________________  EKG  ____________________________________________  RADIOLOGY  ED MD interpretation:   Official radiology report(s): No results found.  ____________________________________________   PROCEDURES  Procedure(s) performed (including Critical Care):  Procedures   ____________________________________________   INITIAL IMPRESSION / ASSESSMENT AND PLAN / ED COURSE      Patient presents for assessment of persistent very mild and gradually improving nonproductive cough after recovering from about a week of fevers, productive cough, posttussive chest pain, congestion, sore throat last month.  On arrival patient is slightly hypertensive with otherwise stable vital signs on room air.  His lungs are clear bilaterally and his abdomen is soft.  Given absence of any worsening of cough, fever, shortness of breath, chest pain patient stating overall he is feeling vastly better but just has a very mild lingering cough I have a low suspicion  for superinfection with a postviral pneumonia.  He does not appear dehydrated and I have a low suspicion for significant metabolic derangements.  He has no abnormal lung sounds hypoxia tachypnea tachycardia or significant work of breathing to suggest an asthma exacerbation, pneumothorax, PE or other immediate life-threatening process.  I suspect possible mild residual but gradually improving bronchitis.  Patient during this entire episode has not been tested for flu or COVID and I think it is reasonable to obtain this today.  Advised patient that if this is negative he can likely safely return to work but if positive he should remain isolated for 5 more days.  He is amenable to plan.  Also advised close outpatient PCP follow-up  as he needs to have his blood pressure rechecked as it was elevated today.  Also advised him that his cough is not improving next couple days will likely need chest x-ray from PCP to rule out more rare causes of cough such as malignancy given his tobacco abuse history.  He states he understands this and will follow-up with PCP.  Discharged in stable condition.  Strict return precautions advised and discussed.  He will follow-up his COVID and influenza PCR results on MyChart.        ____________________________________________   FINAL CLINICAL IMPRESSION(S) / ED DIAGNOSES  Final diagnoses:  Subacute cough  Hypertension, unspecified type    Medications - No data to display   ED Discharge Orders     None        Note:  This document was prepared using Dragon voice recognition software and may include unintentional dictation errors.    Gilles Chiquito, MD 07/23/21 1728    Gilles Chiquito, MD 07/23/21 1729

## 2021-07-23 NOTE — ED Triage Notes (Signed)
Pt comes into the ED via POV c/o cough.  Pt has been feeling badly since last week and his mom tested positive for RSV.  Pt just wanted to get cleared so he can go back to work.

## 2021-09-12 ENCOUNTER — Other Ambulatory Visit: Payer: Self-pay

## 2021-09-12 ENCOUNTER — Emergency Department
Admission: EM | Admit: 2021-09-12 | Discharge: 2021-09-12 | Disposition: A | Discharge status: Home / Self Care | Payer: Self-pay | Attending: Emergency Medicine | Admitting: Emergency Medicine

## 2021-09-12 ENCOUNTER — Emergency Department: Payer: Self-pay

## 2021-09-12 DIAGNOSIS — R112 Nausea with vomiting, unspecified: Secondary | ICD-10-CM

## 2021-09-12 DIAGNOSIS — J45909 Unspecified asthma, uncomplicated: Secondary | ICD-10-CM | POA: Insufficient documentation

## 2021-09-12 DIAGNOSIS — U071 COVID-19: Secondary | ICD-10-CM | POA: Insufficient documentation

## 2021-09-12 DIAGNOSIS — R1011 Right upper quadrant pain: Secondary | ICD-10-CM | POA: Insufficient documentation

## 2021-09-12 LAB — COMPREHENSIVE METABOLIC PANEL
ALT: 14 U/L (ref 0–44)
AST: 18 U/L (ref 15–41)
Albumin: 4.1 g/dL (ref 3.5–5.0)
Alkaline Phosphatase: 40 U/L (ref 38–126)
Anion gap: 7 (ref 5–15)
BUN: 11 mg/dL (ref 6–20)
CO2: 25 mmol/L (ref 22–32)
Calcium: 9.1 mg/dL (ref 8.9–10.3)
Chloride: 105 mmol/L (ref 98–111)
Creatinine, Ser: 1.15 mg/dL (ref 0.61–1.24)
GFR, Estimated: >60 mL/min (ref >60)
Glucose, Bld: 114 mg/dL — ABNORMAL HIGH (ref 70–99)
Potassium: 3.4 mmol/L — ABNORMAL LOW (ref 3.5–5.1)
Sodium: 137 mmol/L (ref 135–145)
Total Bilirubin: 0.8 mg/dL (ref 0.3–1.2)
Total Protein: 7.5 g/dL (ref 6.5–8.1)

## 2021-09-12 LAB — CBC
HCT: 37.2 % — ABNORMAL LOW (ref 39.0–52.0)
Hemoglobin: 12.1 g/dL — ABNORMAL LOW (ref 13.0–17.0)
MCH: 29.7 pg (ref 26.0–34.0)
MCHC: 32.5 g/dL (ref 30.0–36.0)
MCV: 91.4 fL (ref 80.0–100.0)
Platelets: 250 10*3/uL (ref 150–400)
RBC: 4.07 MIL/uL — ABNORMAL LOW (ref 4.22–5.81)
RDW: 12.1 % (ref 11.5–15.5)
WBC: 12.2 10*3/uL — ABNORMAL HIGH (ref 4.0–10.5)
nRBC: 0 % (ref 0.0–0.2)

## 2021-09-12 LAB — LIPASE, BLOOD: Lipase: 30 U/L (ref 11–51)

## 2021-09-12 LAB — RESP PANEL BY RT-PCR (FLU A&B, COVID) ARPGX2
Influenza A by PCR: NEGATIVE
Influenza B by PCR: NEGATIVE
SARS Coronavirus 2 by RT PCR: POSITIVE — AB

## 2021-09-12 MED ORDER — NIRMATRELVIR/RITONAVIR (PAXLOVID)TABLET: 3.0000 | ORAL_TABLET | Freq: Two times a day (BID) | ORAL | 1 refills | Status: AC

## 2021-09-12 MED ORDER — ACETAMINOPHEN 500 MG PO TABS: 1000.0000 mg | ORAL_TABLET | Freq: Once | ORAL | Status: AC

## 2021-09-12 MED ORDER — ACETAMINOPHEN 500 MG PO TABS
ORAL_TABLET | ORAL | Status: AC
  Administered 2021-09-12: 13:00:00 1000 mg via ORAL
  Filled 2021-09-12: qty 2

## 2021-09-12 MED ORDER — ONDANSETRON 8 MG PO TBDP: 8.0000 mg | ORAL_TABLET | Freq: Three times a day (TID) | ORAL | 0 refills | Status: DC | PRN

## 2021-09-12 NOTE — ED Provider Notes (Signed)
[facility] Emergency Department Provider Note   ____________________________________________   Event Date/Time   First MD Initiated Contact with Patient 09/12/21 1511     (approximate)  I have reviewed the triage vital signs and the nursing notes.   HISTORY  Chief Complaint Abdominal Pain    HPI Glenn Acosta is a 30 y.o. male who presents for right upper quadrant abdominal pain  LOCATION: Right upper quadrant abdomen DURATION: 1 day prior to arrival TIMING: Worsening since onset SEVERITY: Moderate QUALITY: Burning pain CONTEXT: Patient states that he has had increasing generalized weakness over the last 2 days then developed into right upper quadrant abdominal pain with associated nausea and vomiting over the last 24 hours. MODIFYING FACTORS: Any p.o. intake worsens his nausea/vomiting and patient denies any exacerbating or relieving factors for the pain itself ASSOCIATED SYMPTOMS: Nausea/vomiting, subjective fever/chills generalized weakness   Per medical record review, patient has history of asthma          Past Medical History:  Diagnosis Date   Asthma     There are no problems to display for this patient.   Past Surgical History:  Procedure Laterality Date   ROTATOR CUFF REPAIR Left     Prior to Admission medications   Medication Sig Start Date End Date Taking? Authorizing Provider  nirmatrelvir/ritonavir EUA (PAXLOVID) 20 x 150 MG & 10 x 100MG  TABS Take 3 tablets by mouth 2 (two) times daily for 5 days. Patient GFR is WNL. Take nirmatrelvir (150 mg) two tablets twice daily for 5 days and ritonavir (100 mg) one tablet twice daily for 5 days. 09/12/21 09/17/21 Yes Amour Cutrone, 11/15/21, MD  ondansetron (ZOFRAN-ODT) 8 MG disintegrating tablet Take 1 tablet (8 mg total) by mouth every 8 (eight) hours as needed for nausea or vomiting. 09/12/21  Yes 09/14/21, MD  amoxicillin-clavulanate (AUGMENTIN) 875-125 MG tablet Take 1 tablet by  mouth 2 (two) times daily. 02/19/21   Cuthriell, 04/21/21, PA-C  brompheniramine-pseudoephedrine-DM 30-2-10 MG/5ML syrup Take 10 mLs by mouth 4 (four) times daily as needed. 02/19/21   Cuthriell, 04/21/21, PA-C  fluticasone (FLONASE) 50 MCG/ACT nasal spray Place 1 spray into both nostrils 2 (two) times daily. 02/19/21   Cuthriell, 04/21/21, PA-C  magic mouthwash w/lidocaine SOLN Take 5 mLs by mouth 4 (four) times daily. 02/19/21   Cuthriell, 04/21/21, PA-C    Allergies Iodine and Shellfish allergy  No family history on file.  Social History Social History   Tobacco Use   Smoking status: Never   Smokeless tobacco: Never   Tobacco comments:    occasional  Vaping Use   Vaping Use: Never used  Substance Use Topics   Alcohol use: No   Drug use: No    Review of Systems Constitutional: Endorses subjective fever/chills Eyes: No visual changes. ENT: No sore throat. Cardiovascular: Denies chest pain. Respiratory: Denies shortness of breath. Gastrointestinal: Endorses right upper quadrant abdominal pain.  Endorses nausea and vomiting.  No diarrhea. Genitourinary: Negative for dysuria. Musculoskeletal: Negative for acute arthralgias Skin: Negative for rash. Neurological: Negative for headaches, weakness/numbness/paresthesias in any extremity Psychiatric: Negative for suicidal ideation/homicidal ideation   ____________________________________________   PHYSICAL EXAM:  VITAL SIGNS: ED Triage Vitals  Enc Vitals Group     BP 09/12/21 1221 112/63     Pulse Rate 09/12/21 1221 95     Resp 09/12/21 1221 19     Temp 09/12/21 1221 (!) 101.7 F (38.7 C)  Temp Source 09/12/21 1221 Oral     SpO2 09/12/21 1221 99 %     Weight --      Height --      Head Circumference --      Peak Flow --      Pain Score 09/12/21 1152 7     Pain Loc --      Pain Edu? --      Excl. in GC? --    Constitutional: Alert and oriented. Well appearing and in no acute distress. Eyes: Conjunctivae are  normal. PERRL. Head: Atraumatic. Nose: No congestion/rhinnorhea. Mouth/Throat: Mucous membranes are moist. Neck: No stridor Cardiovascular: Grossly normal heart sounds.  Good peripheral circulation. Respiratory: Normal respiratory effort.  No retractions. Gastrointestinal: Soft and mild tenderness palpation in the epigastric and right upper quadrant region. No distention. Musculoskeletal: No obvious deformities Neurologic:  Normal speech and language. No gross focal neurologic deficits are appreciated. Skin:  Skin is warm and dry. No rash noted. Psychiatric: Mood and affect are normal. Speech and behavior are normal.  ____________________________________________   LABS (all labs ordered are listed, but only abnormal results are displayed)  Labs Reviewed  RESP PANEL BY RT-PCR (FLU A&B, COVID) ARPGX2 - Abnormal; Notable for the following components:      Result Value   SARS Coronavirus 2 by RT PCR POSITIVE (*)    All other components within normal limits  COMPREHENSIVE METABOLIC PANEL - Abnormal; Notable for the following components:   Potassium 3.4 (*)    Glucose, Bld 114 (*)    All other components within normal limits  CBC - Abnormal; Notable for the following components:   WBC 12.2 (*)    RBC 4.07 (*)    Hemoglobin 12.1 (*)    HCT 37.2 (*)    All other components within normal limits  LIPASE, BLOOD  URINALYSIS, ROUTINE W REFLEX MICROSCOPIC   RADIOLOGY  ED MD interpretation: Right upper quadrant ultrasound shows no evidence of acute cholecystitis with mild diffuse increased echotexture of the liver which can be seen in fatty infiltration.  This may also be a sequelae of acute viral infection  Official radiology report(s): US Abdomen Limited RUQ (LIVER/GB)  Result Date: 09/12/2021 CLINICAL DATA:  Right upper quadrant abdomen pain EXAM: ULTRASOUND ABDOMEN LIMITED RIGHT UPPER QUADRANT COMPARISON:  CT abdomen pelvis April 27, 2012 FINDINGS: Gallbladder: No gallstones or wall  thickening visualized. No sonographic Murphy sign noted by sonographer. Common bile duct: Diameter: 2.5 mm Liver: No focal lesion identified. There is mild diffuse increased echotexture of the liver. Portal vein is patent on color Doppler imaging with normal direction of blood flow towards the liver. Other: None. IMPRESSION: No evidence of acute cholecystitis. Mild diffuse increased echotexture of the liver which can be seen in fatty infiltration of liver. Electronically Signed   By: Sherian Rein M.D.   On: 09/12/2021 13:36    ____________________________________________   PROCEDURES  Procedure(s) performed (including Critical Care):  Procedures   ____________________________________________   INITIAL IMPRESSION / ASSESSMENT AND PLAN / ED COURSE  As part of my medical decision making, I reviewed the following data within the electronic medical record, if available:  Nursing notes reviewed and incorporated, Labs reviewed, EKG interpreted, Old chart reviewed, Radiograph reviewed and Notes from prior ED visits reviewed and incorporated        Presentation most consistent with Viral Syndrome.  Patient has tested positive for COVID-19. Based on vitals and exam they are nontoxic and stable for discharge.  Given  History and Exam I have a lower suspicion for: Emergent CardioPulmonary causes [such as Acute Asthma or COPD Exacerbation, acute Heart Failure or exacerbation, PE, PTX, atypical ACS, PNA]. Emergent Otolaryngeal causes [such as PTA, RPA, Ludwigs, Epiglottitis, EBV].  Regarding Emergent Travel or Immunosuppressive related infectious: I have a low suspicion for acute HIV.  Rx: paxlovid, Zofran  Will provide strict return precautions and instructions on self-isolation/quarantine and anticipatory guidance.      ____________________________________________   FINAL CLINICAL IMPRESSION(S) / ED DIAGNOSES  Final diagnoses:  RUQ pain  COVID-19 virus infection  Right upper  quadrant abdominal pain  Nausea and vomiting, unspecified vomiting type     ED Discharge Orders          Ordered    nirmatrelvir/ritonavir EUA (PAXLOVID) 20 x 150 MG & 10 x 100MG  TABS  2 times daily        09/12/21 1523    ondansetron (ZOFRAN-ODT) 8 MG disintegrating tablet  Every 8 hours PRN        09/12/21 1523             Note:  This document was prepared using Dragon voice recognition software and may include unintentional dictation errors.    09/14/21, MD 09/12/21 [phone]

## 2021-09-12 NOTE — ED Provider Notes (Signed)
Emergency Medicine Provider Triage Evaluation Note  Glenn Acosta , a 30 y.o. male  was evaluated in triage.  Pt complains of abdominal pain and vomiting since 5am. Also c/o fever Pain similar to previous gallbladder issues..  Review of Systems  Positive: Fever, nausea, vomiting, abdominal pain Negative: Diarrhea  Physical Exam  There were no vitals taken for this visit. Gen:   Awake, no distress   Resp:  Normal effort  MSK:   Moves extremities without difficulty  Other:    Medical Decision Making  Medically screening exam initiated at 12:20 PM.  Appropriate orders placed.  Glenn Acosta was informed that the remainder of the evaluation will be completed by another provider, this initial triage assessment does not replace that evaluation, and the importance of remaining in the ED until their evaluation is complete.   Chinita Pester, FNP 09/12/21 1223    Minna Antis, MD 09/12/21 1505

## 2021-09-12 NOTE — ED Triage Notes (Addendum)
Pt comes with c/o right sided abdominal pain since 5am. Pt vomited twice. Pt states hx of gallbladder issues.  Pt has IV in place. Pt was given 4 of zofran.

## 2021-12-21 ENCOUNTER — Other Ambulatory Visit: Payer: Self-pay

## 2021-12-21 ENCOUNTER — Emergency Department
Admission: EM | Admit: 2021-12-21 | Discharge: 2021-12-21 | Disposition: A | Discharge status: Home / Self Care | Payer: BC Managed Care – PPO | Attending: Emergency Medicine | Admitting: Emergency Medicine

## 2021-12-21 DIAGNOSIS — K0889 Other specified disorders of teeth and supporting structures: Secondary | ICD-10-CM | POA: Diagnosis present

## 2021-12-21 DIAGNOSIS — K047 Periapical abscess without sinus: Secondary | ICD-10-CM | POA: Diagnosis not present

## 2021-12-21 MED ORDER — AMOXICILLIN 875 MG PO TABS: 875.0000 mg | ORAL_TABLET | Freq: Two times a day (BID) | ORAL | 0 refills | Status: DC

## 2021-12-21 MED ORDER — AMOXICILLIN 500 MG PO CAPS
1000.0000 mg | ORAL_CAPSULE | Freq: Once | ORAL | Status: AC
  Administered 2021-12-21: 1000 mg via ORAL
  Filled 2021-12-21: qty 2

## 2021-12-21 MED ORDER — HYDROCODONE-ACETAMINOPHEN 5-325 MG PO TABS
1.0000 | ORAL_TABLET | Freq: Once | ORAL | Status: AC
  Administered 2021-12-21: 1 via ORAL
  Filled 2021-12-21: qty 1

## 2021-12-21 MED ORDER — OXYCODONE-ACETAMINOPHEN 5-325 MG PO TABS: 1.0000 | ORAL_TABLET | Freq: Four times a day (QID) | ORAL | 0 refills | Status: DC | PRN

## 2021-12-21 NOTE — Discharge Instructions (Signed)
OPTIONS FOR DENTAL FOLLOW UP CARE ° °[place] Department of Health and Human Services - Local Safety Net Dental Clinics °http://www.ncdhhs.gov/dph/oralhealth/services/safetynetclinics.htm °  °[facility] (336-562-3123) ° °[facility] (919-933-9087) ° °[facility] ([phone] ext [phone]) ° °[facility] ([phone]) ° °[facility] ([phone]) °This clinic caters to the indigent population and is on a lottery system. °Location: °[facility], [address], [place] °Clinic Hours: °Wednesdays from 6pm - 9pm, patients seen by a lottery system. °For dates, call or go to [email or web address].[facility].edu/[facility]/patients/Dental-[facility] °Services: °Cleanings, fillings and simple extractions. °Payment Options: °DENTAL WORK IS FREE OF CHARGE. Bring proof of income or support. °Best way to get seen: °Arrive at 5:15 pm - this is a lottery, NOT first come/first serve, so arriving earlier will not increase your chances of being seen. °  °  °[facility] °[phone] °Select option 1 for emergencies °  °Location: °[facility], [address], [place] °Clinic Hours: °No walk-ins accepted - call the day before to schedule an appointment. °Check in times are 9:30 am and 1:30 pm. °Services: °Simple extractions, temporary fillings, pulpectomy/pulp debridement, uncomplicated abscess drainage. °Payment Options: °PAYMENT IS DUE AT THE TIME OF SERVICE.  Fee is usually $100-200, additional surgical procedures (e.g. abscess drainage) may be extra. °Cash, checks, Visa/MasterCard accepted.  Can file Medicaid if patient is covered for dental - patient should call case worker to check. °No discount for [facility] patients. °Best way to get seen: °MUST call the day before and get onto the schedule. Can usually be seen the next 1-2 days. No walk-ins accepted. °  °  °[facility] °[phone] °   °Location: °[facility], [address], [place] °Clinic Hours: °M, W, Th, F 8am or 1:30pm, Tues 9a or 1:30 - first come/first served. °Services: °Simple extractions, temporary fillings, uncomplicated abscess drainage.  You do not need to be an [place] resident. °Payment Options: °PAYMENT IS DUE AT THE TIME OF SERVICE. °Dental insurance, otherwise sliding scale - bring proof of income or support. °Depending on income and treatment needed, cost is usually $50-200. °Best way to get seen: °Arrive early as it is first come/first served. °  °  °[facility] °[phone] °  °Location: °[address] °Clinic Hours: °Mon-Thu 8a-5p °Services: °Most basic dental services including extractions and fillings. °Payment Options: °PAYMENT IS DUE AT THE TIME OF SERVICE. °Sliding scale, up to 50% off - bring proof if income or support. °Medicaid with dental option accepted. °Best way to get seen: °Call to schedule an appointment, can usually be seen within 2 weeks OR they will try to see walk-ins - show up at 8a or 2p (you may have to wait). °  °  °[facility] °919-245-2435 °[place] RESIDENTS ONLY °  °Location: °[facility], [address], [place], [place] [address] °Clinic Hours: By appointment only. °Monday - Thursday 8am-5pm, Friday 8am-12pm °Services: Cleanings, fillings, extractions. °Payment Options: °PAYMENT IS DUE AT THE TIME OF SERVICE. °Cash, Visa or MasterCard. Sliding scale - $30 minimum per service. °Best way to get seen: °Come in to office, complete packet and make an appointment - need proof of income °or support monies for each household member and proof of [place] residence. °Usually takes about a month to get in. °  °  °[facility] °[phone] °  °Location: °[address]   [place] °Clinic Hours: Walk-in Urgent Care Dental Services are offered Monday-Friday  mornings only. °The numbers of emergencies accepted daily is limited to the number of °providers available. °Maximum 15 - Mondays, Wednesdays & Thursdays °Maximum 10 - Tuesdays & Fridays °Services: °You do not need to be a [place] resident to be seen for a dental emergency. °Emergencies are defined as pain, swelling, abnormal bleeding, or dental trauma. Walkins will receive x-rays if needed. °NOTE: Dental cleaning is not an emergency. °Payment Options: °PAYMENT IS DUE AT THE TIME OF SERVICE. °Minimum co-pay is $40.00 for uninsured patients. °Minimum co-pay is $3.00 for Medicaid with dental coverage. °Dental Insurance is accepted and must be presented at time of visit. °Medicare does not cover dental. °Forms of payment: Cash, credit card, checks. °Best way to get seen: °If not previously registered with the clinic, walk-in dental registration begins at 7:15 am and is on a first come/first serve basis. °If previously registered with the clinic, call to make an appointment. °  °  °[facility] °[phone] °[place] RESIDENTS ONLY °  °Location: °[address], [place], [place] °Clinic Hours: °Mon-Thu 10a-2p °Services: Extractions only! °Payment Options: °FREE (donations accepted) - bring proof of income or support °Best way to get seen: °Call and schedule an appointment OR come at 8am on the 1st Monday of every month (except for holidays) when it is first come/first served. °  °  °[facility] °[phone] °  °Location: °[address], [place] °Clinic Hours: °Friday mornings °Services, Payment Options, Best way to get seen: °Call for info °

## 2021-12-21 NOTE — ED Triage Notes (Signed)
Pt to ED for dental pain, pt believes he has dental abscess under R bottom wisdom tooth which broke on Monday (4d ago). Pt also has broken wisdom tooth on bottom L jaw. ? ?Teeth were already chipped since years ago but pt was hit in face while playing with his kids this week. ? ?Pain is worse on R side. Pt believes R wisdom tooth area is swollen and may have pus. ? ?Pt took 2 tylenol tablets at 2pm and 1tab at 5pm. Pt appears to be in severe pain at this time. ?

## 2021-12-21 NOTE — ED Provider Notes (Signed)
? ?[facility] ?Provider Note ? ?Patient Contact: 8:13 PM (approximate) ? ? ?History  ? ?Dental Pain (2 broken wisdom teeth) ? ? ?HPI ? ?Glenn Acosta is a 31 y.o. male who presents the emergency department complaining of right lower dental pain.  Patient states that he has a history of multiple broken teeth after playing football without a mouthguard.  He had a further injury to his right lower molars while playing with his kids earlier this week.  Patient is starting to have pain and swelling along his jaw.  No fevers or chills, difficulty breathing or swallowing. ?  ? ? ?Physical Exam  ? ?Triage Vital Signs: ?ED Triage Vitals  ?Enc Vitals Group  ?   BP 12/21/21 1848 139/82  ?   Pulse Rate 12/21/21 1848 (!) 58  ?   Resp 12/21/21 1848 16  ?   Temp 12/21/21 1848 98.4 ?F (36.9 ?C)  ?   Temp Source 12/21/21 1848 Oral  ?   SpO2 12/21/21 1848 100 %  ?   Weight 12/21/21 1849 185 lb (83.9 kg)  ?   Height 12/21/21 1849 6\' 2"  (1.88 m)  ?   Head Circumference --   ?   Peak Flow --   ?   Pain Score 12/21/21 1848 10  ?   Pain Loc --   ?   Pain Edu? --   ?   Excl. in GC? --   ? ? ?Most recent vital signs: ?Vitals:  ? 12/21/21 1848  ?BP: 139/82  ?Pulse: (!) 58  ?Resp: 16  ?Temp: 98.4 ?F (36.9 ?C)  ?SpO2: 100%  ? ? ? ?General: Alert and in no acute distress. ? ? ?ENT: ?     Ears:  ?     Nose: No congestion/rhinnorhea. ?     Mouth/Throat: Mucous membranes are moist.  Poor dentition throughout, multiple caries, fractures.  Tooth of concern has a fracture with surrounding erythema.  No fluctuance concerning for appreciable abscess in the gumline.  Palpation of the external jaw reveals tenderness but no palpable findings suspicious for deep space infection or drainable fluid collection. ?Neck: No stridor. No cervical spine tenderness to palpation. ?Hematological/Lymphatic/Immunilogical: No cervical lymphadenopathy. ?Cardiovascular:  Good peripheral perfusion ?Respiratory: Normal respiratory effort without  tachypnea or retractions. Lungs CTAB.  ?Musculoskeletal: Full range of motion to all extremities.  ?Neurologic:  No gross focal neurologic deficits are appreciated.  ?Skin:   No rash noted ?Other: ? ? ?ED Results / Procedures / Treatments  ? ?Labs ?(all labs ordered are listed, but only abnormal results are displayed) ?Labs Reviewed - No data to display ? ? ?EKG ? ? ? ? ?RADIOLOGY ? ? ? ?No results found. ? ?PROCEDURES: ? ?Critical Care performed: No ? ?Procedures ? ? ?MEDICATIONS ORDERED IN ED: ?Medications  ?amoxicillin (AMOXIL) capsule 1,000 mg (has no administration in time range)  ?HYDROcodone-acetaminophen (NORCO/VICODIN) 5-325 MG per tablet 1 tablet (has no administration in time range)  ? ? ? ?IMPRESSION / MDM / ASSESSMENT AND PLAN / ED COURSE  ?I reviewed the triage vital signs and the nursing notes. ?             ?               ? ?Differential diagnosis includes, but is not limited to, dental infection, broken dentition, dental caries ? ? ?Patient's diagnosis is consistent with dental infection.  Patient presented to the emergency department complaining of right lower dental pain.  He has multiple broken teeth and plan sports.  Patient had a further injury to one of his teeth earlier this week.  He has findings concerning for dental infection at this time.  No indication for labs or imaging.  Patient will be started on antibiotics and limited pain medication.  Follow-up with dentist.. Patient is given ED precautions to return to the ED for any worsening or new symptoms. ? ? ? ?  ? ? ?FINAL CLINICAL IMPRESSION(S) / ED DIAGNOSES  ? ?Final diagnoses:  ?Dental infection  ? ? ? ?Rx / DC Orders  ? ?ED Discharge Orders   ? ?      Ordered  ?  amoxicillin (AMOXIL) 875 MG tablet  2 times daily       ? 12/21/21 2027  ?  oxyCODONE-acetaminophen (PERCOCET/ROXICET) 5-325 MG tablet  Every 6 hours PRN       ? 12/21/21 2027  ? ?  ?  ? ?  ? ? ? ?Note:  This document was prepared using Dragon voice recognition software and  may include unintentional dictation errors. ?  ?Racheal Patches, PA-C ?12/21/21 2028 ? ?  ?Gilles Chiquito, MD ?12/22/21 [phone] ? ?

## 2022-05-11 ENCOUNTER — Emergency Department
Admission: EM | Admit: 2022-05-11 | Discharge: 2022-05-11 | Disposition: A | Discharge status: Home / Self Care | Payer: BC Managed Care – PPO | Attending: Physician Assistant | Admitting: Physician Assistant

## 2022-05-11 ENCOUNTER — Other Ambulatory Visit: Payer: Self-pay

## 2022-05-11 ENCOUNTER — Emergency Department: Payer: BC Managed Care – PPO

## 2022-05-11 DIAGNOSIS — Y9289 Other specified places as the place of occurrence of the external cause: Secondary | ICD-10-CM | POA: Diagnosis not present

## 2022-05-11 DIAGNOSIS — M436 Torticollis: Secondary | ICD-10-CM

## 2022-05-11 DIAGNOSIS — X500XXA Overexertion from strenuous movement or load, initial encounter: Secondary | ICD-10-CM | POA: Diagnosis not present

## 2022-05-11 DIAGNOSIS — M542 Cervicalgia: Secondary | ICD-10-CM | POA: Diagnosis present

## 2022-05-11 MED ORDER — CYCLOBENZAPRINE HCL 5 MG PO TABS: 5.0000 mg | ORAL_TABLET | Freq: Three times a day (TID) | ORAL | 0 refills | Status: DC | PRN

## 2022-05-11 MED ORDER — KETOROLAC TROMETHAMINE 60 MG/2ML IM SOLN: 60.0000 mg | Freq: Once | INTRAMUSCULAR | Status: DC

## 2022-05-11 MED ORDER — IBUPROFEN 800 MG PO TABS: 800.0000 mg | ORAL_TABLET | Freq: Three times a day (TID) | ORAL | 0 refills | Status: DC | PRN

## 2022-05-11 MED ORDER — KETOROLAC TROMETHAMINE 30 MG/ML IJ SOLN
30.0000 mg | Freq: Once | INTRAMUSCULAR | Status: DC
  Filled 2022-05-11: qty 1

## 2022-05-11 MED ORDER — KETOROLAC TROMETHAMINE 60 MG/2ML IM SOLN
30.0000 mg | Freq: Once | INTRAMUSCULAR | Status: AC
  Administered 2022-05-11: 30 mg via INTRAMUSCULAR

## 2022-05-11 NOTE — Discharge Instructions (Signed)
Take the prescription meds as directed. Follow-up with a local urgent care center as needed. Apply moist heat to the neck and back.

## 2022-05-11 NOTE — ED Triage Notes (Addendum)
Pt coming from home POV with Neck pain. Pmh clavicle fx years ago. Pt woke up on Wednesday with R sided posterior neck tightness and pain. Pt was at the gym on Tuesday and was deadlifting and states they believe it may have contributed to the pain. Pt stating the pain is centralized over the R posterior muscle. Pt cannot look up/back but can touch chin to chest with no pain. Pt has limited mobility from left to right.   PT has not taken anything for pain, but was given a lido pain patch, with no relief.  Pt denies any recent illness or any other concerns

## 2022-05-11 NOTE — ED Provider Notes (Signed)
[facility] Emergency Department Provider Note     None    (approximate)   History   Neck Injury   HPI  Glenn Acosta is a 31 y.o. male complains of stiff neck.  Patient reports onset on Thursday after spending a day doing some heavy deadlifts.  He denies any bladder or bowel incontinence, chest pain, shortness of breath.  He also denies any distal paresthesias, grip changes.       Physical Exam   Triage Vital Signs: ED Triage Vitals [05/11/22 1416]  Enc Vitals Group     BP (!) 159/97     Pulse Rate 66     Resp 20     Temp 98 F (36.7 C)     Temp src      SpO2 95 %     Weight 190 lb (86.2 kg)     Height      Head Circumference      Peak Flow      Pain Score 10     Pain Loc      Pain Edu?      Excl. in GC?     Most recent vital signs: Vitals:   05/11/22 1416  BP: (!) 159/97  Pulse: 66  Resp: 20  Temp: 98 F (36.7 C)  SpO2: 95%    General Awake, no distress.  HEENT NCAT. PERRL. EOMI. No rhinorrhea. Mucous membranes are moist.  CV:  Good peripheral perfusion.  RESP:  Normal effort.  ABD:  No distention.  MSK:  Neck with no midline tenderness but paraspinal muscle tenderness is appreciated bilaterally.  Patient with limited range of motion in all planes.  Normal composite fist distally.  Normal upper extremity range of motion. NEURO: Cranial nerves II through XII grossly intact.  Normal UE DTRs bilaterally.   ED Results / Procedures / Treatments   Labs (all labs ordered are listed, but only abnormal results are displayed) Labs Reviewed - No data to display   EKG    RADIOLOGY  I personally viewed and evaluated these images as part of my medical decision making, as well as reviewing the written report by the radiologist.  ED Provider Interpretation: no acute findings}  DG Cervical Spine Complete  Result Date: 05/11/2022 CLINICAL DATA:  Right-sided neck stiffness/pain. EXAM: CERVICAL SPINE - COMPLETE 4+ VIEW  COMPARISON:  None Available. FINDINGS: There is mild reversal of the normal cervical lordosis without listhesis. No fracture is identified. Intervertebral disc space heights are preserved. No significant facet arthropathy or osseous neural foraminal narrowing is evident. The prevertebral soft tissues are within normal limits. IMPRESSION: No acute osseous abnormality or significant degenerative changes. Electronically Signed   By: Sebastian Ache M.D.   On: 05/11/2022 15:22     PROCEDURES:  Critical Care performed: No  Procedures   MEDICATIONS ORDERED IN ED: Medications  ketorolac (TORADOL) injection 30 mg (30 mg Intramuscular Given 05/11/22 1428)     IMPRESSION / MDM / ASSESSMENT AND PLAN / ED COURSE  I reviewed the triage vital signs and the nursing notes.                              Differential diagnosis includes, but is not limited to, cervical strain, cervical radiculopathy, acute torticollis, cervical fracture  Patient's presentation is most consistent with acute complicated illness / injury requiring diagnostic workup.  Patient to the ED for evaluation of injury  sustained following delayed onset of muscle stiffness after doing some dead lift work in the gym.  Patient presents to the ED with severe muscle spasm and decreased range of motion of the neck.  No distal paresthesias or red flags on exam.  No radiologic evidence of any acute fracture or dislocation.  Patient's diagnosis is consistent with torticollis. Patient will be discharged home with prescriptions for cyclobenzaprine and ibuprofen. Patient is to follow up with urgent care as needed or otherwise directed. Patient is given ED precautions to return to the ED for any worsening or new symptoms.     FINAL CLINICAL IMPRESSION(S) / ED DIAGNOSES   Final diagnoses:  Torticollis, acute     Rx / DC Orders   ED Discharge Orders          Ordered    cyclobenzaprine (FLEXERIL) 5 MG tablet  3 times daily PRN        05/11/22  1654    ibuprofen (ADVIL) 800 MG tablet  Every 8 hours PRN        05/11/22 1654             Note:  This document was prepared using Dragon voice recognition software and may include unintentional dictation errors.    Lissa Hoard, PA-C 05/11/22 2032    Georga Hacking, MD 05/12/22 [phone]

## 2022-05-11 NOTE — ED Provider Triage Note (Signed)
Emergency Medicine Provider Triage Evaluation Note  Glenn Acosta, a 31 y.o. male  was evaluated in triage.  Pt complains of stiff neck.  Patient reports onset on Thursday after spending a day doing some heavy deadlifts.  He denies any bladder or bowel incontinence, chest pain, shortness of breath.  He also denies any distal paresthesias, grip changes.  Review of Systems  Positive: Stiff neck Negative: CP, SOB  Physical Exam  BP (!) 159/97   Pulse 66   Temp 98 F (36.7 C)   Resp 20   Wt 86.2 kg   SpO2 95%   BMI 24.39 kg/m  Gen:   Awake, no distress  NAD Resp:  Normal effort CTA MSK:   Moves extremities without difficulty creased cervical range of motion.  Normal composite fist bilaterally. CVS:  RRR  Medical Decision Making  Medically screening exam initiated at 2:25 PM.  Appropriate orders placed.  AMIN FORNWALT was informed that the remainder of the evaluation will be completed by another provider, this initial triage assessment does not replace that evaluation, and the importance of remaining in the ED until their evaluation is complete.  Patient to the ED for evaluation of severe stiff neck after doing some dead lift activities.  Denies any distal paresthesias, chest pain, or shortness of breath.   Lissa Hoard, PA-C 05/11/22 1427

## 2022-12-15 ENCOUNTER — Telehealth: Payer: Self-pay | Admitting: Family

## 2022-12-15 ENCOUNTER — Other Ambulatory Visit: Payer: Self-pay

## 2022-12-15 ENCOUNTER — Encounter: Payer: Self-pay | Admitting: Family

## 2022-12-15 ENCOUNTER — Emergency Department: Payer: BC Managed Care – PPO

## 2022-12-15 ENCOUNTER — Emergency Department
Admission: EM | Admit: 2022-12-15 | Discharge: 2022-12-15 | Disposition: A | Discharge status: Home / Self Care | Payer: BC Managed Care – PPO | Attending: Emergency Medicine | Admitting: Emergency Medicine

## 2022-12-15 DIAGNOSIS — M436 Torticollis: Secondary | ICD-10-CM | POA: Insufficient documentation

## 2022-12-15 DIAGNOSIS — M542 Cervicalgia: Secondary | ICD-10-CM | POA: Diagnosis present

## 2022-12-15 MED ORDER — ETODOLAC 400 MG PO TABS: 400.0000 mg | ORAL_TABLET | Freq: Two times a day (BID) | ORAL | 0 refills | Status: AC

## 2022-12-15 MED ORDER — METHOCARBAMOL 500 MG PO TABS: 500.0000 mg | ORAL_TABLET | Freq: Four times a day (QID) | ORAL | 0 refills | Status: AC | PRN

## 2022-12-15 MED ORDER — KETOROLAC TROMETHAMINE 30 MG/ML IJ SOLN
30.0000 mg | Freq: Once | INTRAMUSCULAR | Status: AC
  Administered 2022-12-15: 30 mg via INTRAMUSCULAR
  Filled 2022-12-15: qty 1

## 2022-12-15 NOTE — ED Provider Notes (Signed)
[facility] Provider Note    None    (approximate)   History   Neck Pain   HPI  Glenn Acosta is a 32 y.o. male   presents to the ED with complaint of cervical pain after waking up 4 days ago.  Patient denies any injury to his neck and states that he has had this before in which he took muscle relaxants and improved.      Physical Exam   Triage Vital Signs: ED Triage Vitals  Enc Vitals Group     BP 12/15/22 0655 133/72     Pulse Rate 12/15/22 0655 68     Resp 12/15/22 0655 18     Temp 12/15/22 0655 98 F (36.7 C)     Temp Source 12/15/22 0655 Oral     SpO2 12/15/22 0655 100 %     Weight 12/15/22 0657 180 lb (81.6 kg)     Height 12/15/22 0657 6\' 3"  (1.905 m)     Head Circumference --      Peak Flow --      Pain Score 12/15/22 0657 10     Pain Loc --      Pain Edu? --      Excl. in [place]? --     Most recent vital signs: Vitals:   12/15/22 0655  BP: 133/72  Pulse: 68  Resp: 18  Temp: 98 F (36.7 C)  SpO2: 100%     General: Awake, no distress.  CV:  Good peripheral perfusion.  Resp:  Normal effort.  Abd:  No distention.  Other:  On examination of the cervical spine there is no gross deformity or skin discoloration.  There is some minimal tenderness on palpation of the C5-C7 midline and paravertebral muscles bilaterally.  Range of motion is restricted laterally secondary to discomfort.  Flexion and extension is without restriction.  Good muscle strength at 5/5.  Patient is able to stand and ambulate without any assistance.   ED Results / Procedures / Treatments   Labs (all labs ordered are listed, but only abnormal results are displayed) Labs Reviewed - No data to display    RADIOLOGY Cervical spine x-ray images were reviewed and interpreted by myself independent of the radiologist and was noted to be negative for acute fracture or malalignment.    PROCEDURES:  Critical Care performed:   Procedures   MEDICATIONS  ORDERED IN ED: Medications  ketorolac (TORADOL) 30 MG/ML injection 30 mg (30 mg Intramuscular Given 12/15/22 0717)     IMPRESSION / MDM / ASSESSMENT AND PLAN / ED COURSE  I reviewed the triage vital signs and the nursing notes.   Differential diagnosis includes, but is not limited to, cervical strain, torticollis, degenerative disc disease, osteoarthritis, subluxation.  32 year old male presents to the ED with complaint of cervical pain upon waking up 4 days ago that has been unrelieved by over-the-counter medication.  Patient x-rays were reassuring and made aware that they were negative for fracture or bone disease.  Patient was given Toradol 30 mg IM while in the ED.  A prescription for methocarbamol 500 mg every 6 hours and etodolac 400 mg twice daily was sent to the pharmacy.  Patient is encouraged to use ice or heat to his neck as needed for discomfort and to follow-up with his PCP if any continued problems.      Patient's presentation is most consistent with acute complicated illness / injury requiring diagnostic workup.  FINAL CLINICAL IMPRESSION(S) /  ED DIAGNOSES   Final diagnoses:  Torticollis, acute     Rx / DC Orders   ED Discharge Orders          Ordered    methocarbamol (ROBAXIN) 500 MG tablet  Every 6 hours PRN        12/15/22 0827    etodolac (LODINE) 400 MG tablet  2 times daily        12/15/22 0827             Note:  This document was prepared using Dragon voice recognition software and may include unintentional dictation errors.   Johnn Hai, PA-C 12/15/22 [id number]    Carrie Mew, MD 12/16/22 [phone]

## 2022-12-15 NOTE — Discharge Instructions (Addendum)
Call make appointment with your primary care provider if any continued problems or concerns.  Begin taking the muscle relaxant methocarbamol every 6 hours as needed for muscle spasms.  Do not drive while taking this medication as it could cause drowsiness and increase your risk for injury.  Etodolac is 400 mg twice daily with food.  You may use ice or heat to your neck as needed for discomfort.

## 2022-12-15 NOTE — ED Triage Notes (Signed)
Neck pain that has been ongoing x 3 months. Reports seen initially and told he had a contusion. States pain has increased again in the last 3 days. Pt ambulatory to triage. Alert and oriented. Breathing unlabored speaking in full sentences. CMS intact.

## 2022-12-15 NOTE — Telephone Encounter (Signed)
Patient called and he is leaving [facility] ED and they told him to follow up with PCP. Was diagnosed with acute torticollis. May need to see neck specialist. I transferred him up front to schedule a follow up but he asked that I send Estill Bamberg a message to let her know what's going on.

## 2022-12-16 ENCOUNTER — Ambulatory Visit (INDEPENDENT_AMBULATORY_CARE_PROVIDER_SITE_OTHER): Payer: BC Managed Care – PPO | Admitting: Family

## 2022-12-16 VITALS — BP 138/68 | HR 63 | Ht 75.0 in | Wt 174.0 lb

## 2022-12-16 DIAGNOSIS — M436 Torticollis: Secondary | ICD-10-CM

## 2022-12-16 MED ORDER — KETOROLAC TROMETHAMINE 10 MG PO TABS: 10.0000 mg | ORAL_TABLET | Freq: Four times a day (QID) | ORAL | 0 refills | Status: AC | PRN

## 2022-12-16 NOTE — Progress Notes (Unsigned)
Established Patient Office Visit  Subjective:  Patient ID: Glenn Acosta, male    DOB: 1991-06-18  Age: 32 y.o. MRN: [id number]  Chief Complaint  Patient presents with   Acute Visit    Neck Pain   Torticollis    Neck Pain  This is a recurrent problem. The current episode started in the past 7 days. The problem occurs constantly. The problem has been unchanged. The pain is associated with nothing. The pain is present in the left side. The quality of the pain is described as aching and stabbing. The pain is at a severity of 7/10. The symptoms are aggravated by bending, twisting and position. The pain is Worse during the day. Stiffness is present In the morning and all day. Pertinent negatives include no chest pain, fever, headaches, leg pain, numbness, pain with swallowing, paresis, photophobia, syncope, tingling, trouble swallowing, visual change, weakness or weight loss. He has tried acetaminophen, bed rest, heat, ice, home exercises, muscle relaxants, NSAIDs and neck support for the symptoms. The treatment provided mild relief.    No other concerns at this time.   Past Medical History:  Diagnosis Date   Asthma     Past Surgical History:  Procedure Laterality Date   ROTATOR CUFF REPAIR Left 2008    Social History   Socioeconomic History   Marital status: Single    Spouse name: Not on file   Number of children: Not on file   Years of education: Not on file   Highest education level: Not on file  Occupational History   Not on file  Tobacco Use   Smoking status: Never   Smokeless tobacco: Never   Tobacco comments:    occasional  Vaping Use   Vaping Use: Never used  Substance and Sexual Activity   Alcohol use: No   Drug use: No   Sexual activity: Yes  Other Topics Concern   Not on file  Social History Narrative   Not on file   Social Determinants of Health   Financial Resource Strain: Not on file  Food Insecurity: Not on file  Transportation Needs: Not on  file  Physical Activity: Not on file  Stress: Not on file  Social Connections: Not on file  Intimate Partner Violence: Not on file    Family History  Problem Relation Age of Onset   Cancer Paternal Grandmother    Cancer Paternal Grandfather     Allergies  Allergen Reactions   Iodine Hives   Shellfish Allergy Hives    Review of Systems  Constitutional:  Negative for fever and weight loss.  HENT:  Negative for trouble swallowing.   Eyes:  Negative for photophobia.  Cardiovascular:  Negative for chest pain and syncope.  Musculoskeletal:  Positive for neck pain.  Neurological:  Negative for tingling, weakness, numbness and headaches.  All other systems reviewed and are negative.      Objective:   BP 138/68   Pulse 63   Ht 6\' 3"  (1.905 m)   Wt 174 lb (78.9 kg)   SpO2 98%   BMI 21.75 kg/m   Vitals:   12/16/22 0938  BP: 138/68  Pulse: 63  Height: 6\' 3"  (1.905 m)  Weight: 174 lb (78.9 kg)  SpO2: 98%  BMI (Calculated): 21.75    Physical Exam Vitals and nursing note reviewed.  Constitutional:      Appearance: Normal appearance. He is normal weight.  Eyes:     Pupils: Pupils are equal, round, and reactive  to light.  Cardiovascular:     Rate and Rhythm: Normal rate and regular rhythm.     Pulses: Normal pulses.     Heart sounds: Normal heart sounds.  Pulmonary:     Effort: Pulmonary effort is normal.     Breath sounds: Normal breath sounds.  Musculoskeletal:     Cervical back: Torticollis and tenderness present. Pain with movement and muscular tenderness present.  Neurological:     Mental Status: He is alert.  Psychiatric:        Mood and Affect: Mood normal.        Behavior: Behavior normal.      No results found for any visits on 12/16/22.  No results found for this or any previous visit (from the past 2160 hour(s)).    Assessment & Plan:   Problem List Items Addressed This Visit   None Visit Diagnoses     Torticollis, acute    -  Primary    Patient says the toradol injection at the hospital yesterday was very helpful.  Will send a short supply. He will let me know if not improving.     Also suggested that he get a new pillow for sleep, as this has recurred now, and I suspect that this might be related.   No follow-ups on file.   Total time spent: 20 minutes  Mechele Claude, FNP  12/16/2022

## 2022-12-17 ENCOUNTER — Encounter: Payer: Self-pay | Admitting: Family

## 2022-12-17 DIAGNOSIS — J45909 Unspecified asthma, uncomplicated: Secondary | ICD-10-CM | POA: Insufficient documentation

## 2022-12-17 DIAGNOSIS — R7303 Prediabetes: Secondary | ICD-10-CM | POA: Insufficient documentation

## 2022-12-17 DIAGNOSIS — E559 Vitamin D deficiency, unspecified: Secondary | ICD-10-CM | POA: Insufficient documentation

## 2023-02-01 ENCOUNTER — Encounter: Payer: Self-pay | Admitting: Family

## 2023-04-08 ENCOUNTER — Ambulatory Visit: Payer: Self-pay | Admitting: Family
